# Patient Record
Sex: Male | Born: 1985 | Race: White | Marital: Single | Smoking: Current every day smoker
Health system: Northeastern US, Academic
[De-identification: ages and names within clinical notes are randomized; demographics above are authoritative.]

## PROBLEM LIST (undated history)

## (undated) DIAGNOSIS — F419 Anxiety disorder, unspecified: Secondary | ICD-10-CM

## (undated) DIAGNOSIS — K219 Gastro-esophageal reflux disease without esophagitis: Secondary | ICD-10-CM

## (undated) DIAGNOSIS — N200 Calculus of kidney: Secondary | ICD-10-CM

## (undated) DIAGNOSIS — M5020 Other cervical disc displacement, unspecified cervical region: Secondary | ICD-10-CM

## (undated) DIAGNOSIS — M5126 Other intervertebral disc displacement, lumbar region: Secondary | ICD-10-CM

## (undated) DIAGNOSIS — Z87442 Personal history of urinary calculi: Secondary | ICD-10-CM

## (undated) DIAGNOSIS — F32A Depression, unspecified: Secondary | ICD-10-CM

## (undated) HISTORY — DX: Gastro-esophageal reflux disease without esophagitis: K21.9

## (undated) HISTORY — DX: Other cervical disc displacement, unspecified cervical region: M50.20

## (undated) HISTORY — DX: Anxiety disorder, unspecified: F41.9

## (undated) HISTORY — PX: APPENDECTOMY: SHX54

## (undated) HISTORY — PX: OTHER SURGICAL HISTORY: SHX169

## (undated) HISTORY — PX: WISDOM TOOTH EXTRACTION: SHX21

## (undated) HISTORY — PX: KNEE SURGERY: SHX244

---

## 2010-06-04 ENCOUNTER — Emergency Department
Admission: EM | Admit: 2010-06-04 | Disposition: A | Discharge status: Home / Self Care | Payer: Self-pay | Source: Ambulatory Visit | Attending: Emergency Medicine | Admitting: Emergency Medicine

## 2010-06-04 ENCOUNTER — Encounter: Payer: Self-pay | Admitting: Emergency Medicine

## 2010-06-04 MED ORDER — OXYCODONE-ACETAMINOPHEN 5-325 MG PO TABS *I*
1.0000 | ORAL_TABLET | ORAL | Status: AC | PRN
Start: 2010-06-04 — End: 2010-06-14

## 2010-06-04 MED ORDER — MORPHINE SULFATE 4 MG/ML IJ SOLN
4.0000 mg | Freq: Once | INTRAMUSCULAR | Status: AC
Start: 2010-06-04 — End: 2010-06-04
  Filled 2010-06-04: qty 1

## 2010-06-04 MED ADMIN — Morphine Sulfate Inj 4 MG/ML: 4 mg | INTRAMUSCULAR | NDC 00409125830

## 2010-06-04 MED ADMIN — Morphine Sulfate Inj 4 MG/ML: 4 mg | INTRAVENOUS | NDC 00409125830

## 2010-06-04 NOTE — ED Notes (Signed)
Ortho at bedside.
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
## 2010-06-04 NOTE — ED Notes (Signed)
Sling provided to patient per order

## 2010-06-04 NOTE — ED Notes (Signed)
Pt given d/c instructions along with scripts and verbalized understanding. IV's d/c. Pt given sling for cast. D/c with family.

## 2010-06-04 NOTE — ED Notes (Signed)
Ortho at bedside to splint the patient.
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
## 2010-06-04 NOTE — ED Notes (Signed)
Awaiting ortho consult.  Girlfriend at bedside.

## 2010-06-04 NOTE — ED Notes (Signed)
Pt stated that his sacrum is sore from sitting and pt requested that c collar be removed. Pt mother had concerns about pt receiving morphine for pain. Pt is on probation.
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
## 2017-06-07 ENCOUNTER — Encounter: Payer: Self-pay | Admitting: Urology

## 2017-06-07 ENCOUNTER — Ambulatory Visit: Payer: Self-pay | Attending: Urology | Admitting: Urology

## 2017-06-07 ENCOUNTER — Telehealth: Payer: Self-pay | Admitting: Urology

## 2017-06-07 VITALS — BP 169/99 | HR 100 | Temp 98.8°F | Ht 71.0 in | Wt 250.0 lb

## 2017-06-07 DIAGNOSIS — N202 Calculus of kidney with calculus of ureter: Secondary | ICD-10-CM

## 2017-06-07 DIAGNOSIS — N201 Calculus of ureter: Secondary | ICD-10-CM | POA: Insufficient documentation

## 2017-06-07 LAB — POCT URINALYSIS DIPSTICK
Glucose,UA POCT: NORMAL mg/dL
Leuk Esterase,UA POCT: NEGATIVE
Lot #: 29981701
Nitrite,UA POCT: NEGATIVE
PH,UA POCT: 5 (ref 5–8)
Protein,UA POCT: NEGATIVE mg/dL
Specific gravity,UA POCT: 1.02 (ref 1.002–1.030)

## 2017-06-07 MED ORDER — TAMSULOSIN HCL 0.4 MG PO CAPS *I*
0.4000 mg | ORAL_CAPSULE | Freq: Every evening | ORAL | 0 refills | Status: DC
Start: 2017-06-07 — End: 2018-08-19

## 2017-06-07 NOTE — Progress Notes (Signed)
CHIEF COMPLAINT: Right ureteral stone with renal colic.    HISTORY AND PRESENT ILLNESS: [name] is a 31 y.o. patient who was just diagnosed with his first kidney stone.  Two days ago, he developed acute onset right lower quadrant pain, 9/10 in severity. He also had nausea. He tried oxycodone but vomited. He did not have any fever/chills.     He was seen in the ED at [facility] and a CT scan was obtained.  He had a 3.59mm right distal ureteral stone with mild right hydroureteronephrosis and a non-obstructing left lower pole renal stone. His WBC and Cr were both mildly elevated. UA showed 3+ blood but no nitrites or leukocyte esterase.  He was given flomax, oxycodone and clindamycin (for concurrent treatment of a right tooth abscess) and discharged to home.      He reports that the pain is still present, 3/10 in severity. Tramadol helps. He is still taking flomax. He is straining his urine, but he has not witnessed passage. He reports dysuria and urinary urgency. He denies lower urinary tract obstructive symptoms, such as slow and thin urine stream, voiding difficulty, and feelings of unable to empty the bladder.    He is able to stay hydrated, but is not eating much.    ALLERGIES:   Review of patient's allergies indicates no known allergies (drug, envir, food or latex).     CURRENT MEDICATIONS:    Current Outpatient Prescriptions   Medication Sig Note    tamsulosin (FLOMAX) 0.4 MG capsule Take 0.4 mg by mouth 06/07/2017: Received from: [facility] Received Sig: Take 1 capsule by mouth daily.    clindamycin (CLEOCIN) 300 MG capsule Take 300 mg by mouth 06/07/2017: Received from: [facility] Received Sig: Take 1 capsule by mouth 4 (four) times daily.    traMADol-acetaminophen (ULTRACET) 37.5-325 MG per tablet Take 1 tablet by mouth 06/07/2017: Received from: [facility] Received Sig: Take 1 tablet by mouth every 6 (six) hours as needed for Pain.    cyclobenzaprine (FLEXERIL)  10 MG tablet Take 10 mg by mouth 06/07/2017: Received from: [facility] Received Sig: Take 10 mg by mouth 3 (three) times daily as needed for Muscle spasms.     No current facility-administered medications for this visit.         PAST MEDICAL HISTORY:    Herniated disk    PAST SURGICAL HISTORY:  Appendectomy  Left knee arthroscopy     FAMILY HISTORY:   Dad - enlarged prostate. Mom- urinary incontinence. Maternal grandfather - prostate cancer.     SOCIAL HISTORY:  Social History     Social History    Marital status: Single     Spouse name: N/A    Number of children: N/A    Years of education: N/A     Social History Main Topics    Smoking status: Current Every Day Smoker     Packs/day: 0.50    Smokeless tobacco: Never Used    Alcohol use No    Drug use: None    Sexual activity: Not Asked     Other Topics Concern    None     Social History Narrative   He works in Engineering geologist at [employer].    REVIEW OF SYSTEMS:   Constitutional: Denies recent weight loss or gain or fatigue.  Eyes: Denies change in vision  ENT: Denies hearing problems, or loss. Denies swollen glands or stiff neck.  Cardiovascular: Denies recent heart  trouble, chest pain or palpitations.  Respiratory: Denies recent cold, flu or upper respiratory illness. Denies cough or dyspnea.  Gastrointestinal: Denies constipation, diarrhea, acid reflux or bloody stool.  Positive for right lower quadrant pain.  Genitourinary: Positive for dysuria and urinary urgency.  No obstructive urinary symptoms.  MSK: Denies any muscle or joint pain stiffness or arthritis.  Skin: Denies rash, ulcers or skin problems.  Neuro: Denies numbness, tingling, dizziness or headaches.  Pysch: Denies depression, anxiety or psychosis.  Endocrine: Denies diabetes, hypothyroidism or hyperthyroidism.  Hematology: Denies recent bleeding, bruising or anemia.  Allergy/Immunological: Denies runny nose, allergic rhinitis, or itching eyes.    PHYSICAL EXAM:  BP (!) 169/99    Pulse 100   Temp 37.1 C (98.8 F)   Ht 1.803 m (5\' 11" )   Wt 113.4 kg (250 lb)   BMI 34.87 kg/m2  GENERAL:  No acute distress, well developed, well nourished  HEENT:  Normocephalic, atraumatic. Extraocular movements intact. Oropharynx clear.  Neck supple. Trachea midline. Mucous membranes moist.  RESPIRATORY:  Respirations unlabored on room air.    ABDOMINAL:  Abdomen soft, tender to palpation in the lower quadrants. Nondistended, and without masses.   BACK/ORTHO:  No costovertebral angle tenderness.  No tenderness of the axial skeleton.  No obvious back deformities.  GENITOURINARY: Normal circumcised phallus with redundant foreskin.  Normal meatus.  No meatal discharge.  Cutaneous examination of phallus and scrotum unremarkable.  Bilateral testes measure approximately 3 cm in the craniocaudad axis and are without suspicious masses or tenderness.   No suspicious epididymal or cord masses/tenderness.  LYMPHATIC:  Normal inguinal examination.  PULSES:  groin palpable bilaterally and symmetric.   EXTREMITIES:  Without cyanosis, clubbing, or edema. Calves non-tender.  SKIN:  Normal color, turgor, texture, hydration.  NEUROLOGIC:  Oriented to person, place, time, and situation.  PSYCHIATRIC:  Normal mood and affect.    LABS:  Recent Results (from the past 24 hour(s))   POCT urinalysis dipstick    Collection Time: 06/07/17  1:44 PM   Result Value Ref Range    Specific gravity,UA POCT 1.020 1.002 - 1.030    PH,UA POCT 5.0 5 - 8    Leuk Esterase,UA POCT Negative Negative    Nitrite,UA POCT Negative Negative    Protein,UA POCT Negative Negative mg/dL    Glucose,UA POCT Normal Normal mg/dL    Ketones,UA POCT ++ moderate Negative mg/dL    Urobilinogen,UA  Less than 1 mg/dL    Bilirubin,Ur  Negative    Blood,UA POCT About 250 (!) Negative    Exp date 06/21/2018     Lot # [id number]      OSH labs:  WBC 12.5  Cr 1.2, Ca 9.4    CT scan abdomen/pelvis 06/05/17 (report from OSH and imaging both reviewed):  There is mild right  hydronephrosis and hydroureter with a 3.5 mm obstructing calculus in the distal right ureter image 3-152. There is mild right perinephric stranding. There is a 2 to 3 mm nonobstructing calculus in the inferior pole the left kidney. The unenhanced kidneys are otherwise normal.   Bladder:The bladder is decompressed but otherwise normal.       IMPRESSION: Right renal colic secondary to an obstructing 3.7mm right distal ureteral stone with hydroureteronephrosis. Non-obstructing 73mm left renal stone.     PLAN:   I reviewed the outside imaging and labs with the patient and mother.  Likelihood of spontaneous passage of the right ureteral stone is high given the size and location. We  discussed management options, including continued observation, medical management, or ureteroscopy with/without laser lithotripsy.     I discussed that the risk of continued observation includes persistent or worsening symptoms and further deterioration of renal function which may result in loss of the kidney.   Surgery would involve general anesthesia and removal of the stone under direct visualization which may require fragmentation into smaller pieces using laser.  A temporary ureteral stent would be placed to allow the kidney to drain while the tissues are healing following surgery.  I also explained that in the setting of an impacted stone we may not be able to safely reach the stone with our scope.  In this case I would leave a stent and allow the ureter to passively dilate and proceed with a second stage procedure at a later date.  I also discussed that some patients may experience stent colic and we have medications that can alleviate the symptoms in most cases.    At this time, he would like to proceed with a trial of passage. I recommended that he continue flomax (refill was given to extend course to 6 weeks). He should alternate tylenol with ibuprofen (600mg  TID x1wk) as needed for pain, and he can take the tramadol in between  for additional pain relief.  I encouraged him to increase oral hydration. He will continue to strain his urine in the interim and save any passed stone for stone analysis.      We will see him back in one month for clinical reassessment.  He was advised to call for sooner evaluation if pain is not well controlled, UTI symptoms or fever develops, or if he develops intractable nausea/vomiting.  We will send a urine culture in case he may require surgery. All questions were answered to patient and mom's satisfaction.      Sincerely,    Smiley Houseman, MD

## 2017-06-07 NOTE — Telephone Encounter (Signed)
Patient's mother, Arline Asp, is calling to schedule an appointment for [facility]. She states she brought him to the Emergency Department in [place] on Wednesday night, where they discovered he has kidney stones. She states she had to bring him again last night because his pain was an 8/10. She states they suggested Ronaldo Miyamoto see an Insurance underwriter today for a follow up.     Please return Cindy's call at [phone] to discuss further

## 2017-06-07 NOTE — Telephone Encounter (Signed)
Spoke with pt who stated CT results while in ED showed kidney stones on Wednesday on both sides. Continued with pain last night and returned to ED due to inability to urinate and uncontrolled pain. Per Dr Reynolds Bowl pt can come to [facility] office at 130 today. Please add to her schedule.

## 2017-06-07 NOTE — Telephone Encounter (Signed)
Patient is calling regarding the information below. He can be reached at [phone]

## 2017-06-08 LAB — AEROBIC CULTURE: Aerobic Culture: 0

## 2017-06-10 NOTE — Telephone Encounter (Signed)
Patient is calling to request a sooner appointment with Dr. Reynolds Bowl. Patient states he was seen in the ED on Saturday again for kidney stones. Per cadence first available is 9/4.    Please return his call at: [phone]

## 2017-06-10 NOTE — Telephone Encounter (Signed)
You saw this patient on Friday. He went to the ED at [facility]. When would you like to see patient back in the office?

## 2017-06-12 ENCOUNTER — Other Ambulatory Visit: Payer: Self-pay | Admitting: Urology

## 2017-06-12 ENCOUNTER — Encounter: Payer: Self-pay | Admitting: Gastroenterology

## 2017-06-12 ENCOUNTER — Encounter: Payer: Self-pay | Admitting: Urology

## 2017-06-12 ENCOUNTER — Other Ambulatory Visit: Payer: Self-pay | Admitting: Emergency Medicine

## 2017-06-12 DIAGNOSIS — N201 Calculus of ureter: Secondary | ICD-10-CM

## 2017-06-12 LAB — URINALYSIS WITH REFLEX TO MICROSCOPIC
Bilirubin,Ur: NEGATIVE
Glucose, Ur: NEGATIVE mg/dL
Ketones, UA: NEGATIVE mg/dL
Nitrite,UA: NEGATIVE
Protein,UA: NEGATIVE mg/dL
RBC,UA: 0 /HPF (ref 0–5)
Specific Gravity,UA: 1.013 (ref 1.005–1.030)
Urobilinogen,UA: 1 mg/dL
WBC,UA: >40 /HPF — ABNORMAL HIGH (ref 0–5)
pH: 6 (ref 5.0–8.0)

## 2017-06-12 LAB — BASIC METABOLIC PANEL
Anion Gap: 16 (ref 7–16)
CO2: 27 mmol/L (ref 20–28)
Calcium: 9.8 mg/dL (ref 9.0–10.3)
Chloride: 100 mmol/L (ref 96–108)
Creatinine: 0.97 mg/dL (ref 0.67–1.17)
GFR,Black: 112 mL/min/{1.73_m2}
GFR,Caucasian: 92 mL/min/{1.73_m2}
Glucose: 94 mg/dL (ref 60–99)
Lab: 10 mg/dL (ref 6–20)
Potassium: 4.2 mmol/L (ref 3.4–4.7)
Sodium: 143 mmol/L (ref 133–145)

## 2017-06-12 LAB — CBC AND DIFFERENTIAL
Baso # K/uL: 0.1 10*3/uL (ref 0.0–0.1)
Basophil %: 0.4 % (ref 0.1–1.2)
Eos # K/uL: 0.3 10*3/uL (ref 0.0–0.5)
Eosinophil %: 2.1 % (ref 0.8–7.0)
Hematocrit: 40.2 % (ref 40.1–51.0)
Hemoglobin: 14 g/dL (ref 13.7–17.5)
Immature Granulocytes Absolute: 0.09 10*3/uL (ref 0.0–0.2)
Immature Granulocytes: 0.7 % (ref 0.0–2.0)
Lymph # K/uL: 3.2 10*3/uL (ref 1.3–3.6)
Lymphocyte %: 23.6 % (ref 21.8–53.1)
MCH: 28.9 pg (ref 25.7–32.2)
MCHC: 34.8 g/dL (ref 32.3–36.5)
MCV: 83.1 fL (ref 79.0–92.2)
Mono # K/uL: 0.8 10*3/uL (ref 0.3–0.8)
Monocyte %: 5.8 % (ref 5.3–12.2)
Neut # K/uL: 9.1 10*3/uL — ABNORMAL HIGH (ref 1.8–5.4)
Nucl RBC # K/uL: 0 /100 WBC (ref 0.0–0.2)
Platelets: 357 10*3/uL — ABNORMAL HIGH (ref 163–337)
RBC Distribution Width-SD: 37.2 fL (ref 35.1–43.9)
RBC: 4.84 10*6/uL (ref 4.63–6.08)
RDW: 12.2 % (ref 11.6–14.4)
Seg Neut %: 67.4 % (ref 34.0–67.9)
WBC: 13.5 10*3/uL — ABNORMAL HIGH (ref 4.2–9.1)

## 2017-06-12 MED ORDER — OXYBUTYNIN CHLORIDE 5 MG PO TABS *I*
5.0000 mg | ORAL_TABLET | Freq: Three times a day (TID) | ORAL | 0 refills | Status: DC | PRN
Start: 2017-06-12 — End: 2018-08-19

## 2017-06-12 MED ORDER — PHENAZOPYRIDINE HCL 100 MG PO TABS *I*
100.0000 mg | ORAL_TABLET | Freq: Three times a day (TID) | ORAL | 0 refills | Status: DC | PRN
Start: 2017-06-12 — End: 2018-08-19

## 2017-06-12 MED ORDER — CIPROFLOXACIN HCL 500 MG PO TABS *I*
500.0000 mg | ORAL_TABLET | Freq: Two times a day (BID) | ORAL | 0 refills | Status: DC
Start: 2017-06-12 — End: 2018-04-22

## 2017-06-13 ENCOUNTER — Telehealth: Payer: Self-pay | Admitting: Urology

## 2017-06-13 LAB — AEROBIC CULTURE

## 2017-06-13 NOTE — Telephone Encounter (Signed)
Spoke with pt who is requesting call from MD, states he does not remember anything she spoke with him about after stent placement and questioning if stone was removed. Nurse unable to answer this question. Writer told pt message would be sent to MD.

## 2017-06-13 NOTE — Telephone Encounter (Signed)
[name] is calling to speak to a nurse about his stent . He stated that he was under anesthesia and he doesn't remember anything that the nurse told him about the stent.    Patient can be reach [phone] (H)

## 2017-06-14 ENCOUNTER — Telehealth: Payer: Self-pay | Admitting: Urology

## 2017-06-14 NOTE — Telephone Encounter (Signed)
Called patient in regards to appointments and he is still waiting for a call back with regards to his surgery and still has some questions with regards to weigh limits.

## 2017-06-14 NOTE — Telephone Encounter (Signed)
Patient is calling to ask some questions about lifting and more specific information. What does "Limit Lifting" mean?    Patient can be reached at [phone]

## 2017-06-16 LAB — STONE ANALYSIS
Calculi Mass: 26 mg
Calculi Number: 1
Stone Weight: 1 mm

## 2017-06-17 ENCOUNTER — Telehealth: Payer: Self-pay | Admitting: Urology

## 2017-06-17 ENCOUNTER — Encounter: Payer: Self-pay | Admitting: Urology

## 2017-06-17 NOTE — Telephone Encounter (Signed)
When can pt go back to work

## 2017-06-17 NOTE — Telephone Encounter (Signed)
Previous encounter indicated patient may return to work without restriction.

## 2017-06-17 NOTE — Telephone Encounter (Signed)
Patient called and stated that he is supposed to be starting back to work tomorrow (06/18/17) and is waiting for a call so that he can pick up a letter.     He asked to be called back today at [phone].

## 2017-06-17 NOTE — Telephone Encounter (Signed)
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 He can be reached at [phone] (H)

## 2017-07-01 ENCOUNTER — Telehealth: Payer: Self-pay | Admitting: Urology

## 2017-07-01 NOTE — Telephone Encounter (Signed)
Spoke to patient and confirmed upcoming appointment on 07/04/2017

## 2017-07-04 ENCOUNTER — Encounter: Payer: Self-pay | Admitting: Urology

## 2017-07-04 ENCOUNTER — Ambulatory Visit: Payer: Self-pay | Attending: Urology | Admitting: Urology

## 2017-07-04 ENCOUNTER — Ambulatory Visit: Payer: Self-pay | Admitting: Urology

## 2017-07-04 VITALS — BP 128/76 | HR 79 | Ht 71.0 in | Wt 255.0 lb

## 2017-07-04 DIAGNOSIS — N200 Calculus of kidney: Secondary | ICD-10-CM

## 2017-07-04 MED ORDER — CIPROFLOXACIN HCL 500 MG PO TABS *I*
500.0000 mg | ORAL_TABLET | Freq: Once | ORAL | Status: AC
Start: 2017-07-04 — End: 2017-07-04
  Administered 2017-07-04: 500 mg via ORAL

## 2017-07-04 NOTE — Patient Instructions (Addendum)
Cystoscopy Frequently Asked Questions    Indication:  To evaluate blood in the urine, to assess possible causes of incontinence.    Do we use a numbing agent?   We may use 2% lidocaine jelly, depending on your diagnosis.  It is a gel that is placed into the urethra.  No needle is used.  The numbing effect will last 1 to 2 hours.  Pt will still be able to feel the urge to urinate.    Should I eat or drink the day of the procedure?  Yes!  Please eat and drink as normal the day of the procedure.  Being well hydrated will help the doctor better visualize the ureter and kidney function.    Are there any medications I should stop taking before the procedure?  No.  You may take all your medications.    How long will the procedure take?  You should plan to be in the office for at least an hour to allow time for paperwork and prep.  From the time the doctor enters the room, the procedure itself takes between 5 to 15 minutes to complete.    What will happen during my procedure?  You will be asked to provide a urine specimen prior to the procedure to ensure there is no current infection.  If you are found to have an infection, the procedure may be rescheduled at the Doctors discretion.  You will be brought back to a procedure room, vital signs will be take, and you will be asked to sign a consent form.  You will then be asked to undress from the waist down, and you will lie down on the procedure table.  Your genitals will be cleansed with an antibacterial soap, and the lidocaine gel will be instilled into the urethra.  The doctor will insert the cystoscope into the bladder via the urethra.  He will be able to visualize the urethra, the bladder and the ureters.  A sample of the fluid taken from the bladder will be sent to pathology to look for any abnormal cells.  The result takes 7-10 days to come back.  We will either call you with the result, or your doctor may have you return to the office to discuss the results of all  tests.    What kind of scope is used?  We use a flexible cystoscope in our office.    Will it hurt?  Most people feel a large amount of pressure, a strong urge to urinate and a little burning along the urethra, although the experience is is different for everyone.      Will I have antibiotics afterward?  This will be at the discretion of your doctor.    How long before I may resume sexual activity?  The same day.      Will I have any after effects?  You may experience burning with urination and a small amount of blood in the urine for a few days afterwards.  This is normal.  Drink plenty of fluids for a few days to keep the urinary tract flushed out.    Contact your Doctor if you develop the following:  · Excessive pain  · Prolonged excessive bleeding or temperature greater then 101 degrees fahrenheit     Will I need to be out of work or restrict my activities for any length of time afterward?  You may return to all normal activities the same day.            Quitting Smoking for Older Adults    About this topic   You may have started smoking when you were much younger. As an adult, you may smoke regularly. It is possible to become addicted to smoking. The longer you have been smoking, the harder it is to give up.  Most people have heard of the dangers of smoking. It is helpful to your health if you stop smoking. It also helps the health of others.  General   Older smokers are at high risk from smoking. The longer you have smoked, the more toxins you have been exposed to. Toxins are the bad chemicals in the tobacco. As someone who has smoked for a long time, you are more likely to have illnesses related to smoking.  It is never too late to quit smoking. It helps your health even at an older age. You will begin to notice changes soon after you stop smoking. Here are some steps you can take to help you quit smoking:  · Set a date to quit smoking.  · Pay attention to when you smoke now and why you are smoking. Some people  find it helpful to write down each time you smoke. Include the hour and what you are doing. This will let you plan ahead about what you will do instead of smoking during those times.  · Slowly reduce your smoking until your quit date.  · Remove cigar and other tobacco products from your home, car, and workplace.  · Avoid places and situations where you are more likely to smoke. If people close to you smoke, ask them to quit with you.  · Reward or treat yourself every time you do not smoke. Do not use food as a reward. Perhaps place the cost of the cigarette you did not smoke into a collection jar and save the change towards a special occasion or reward.  · Ask your doctor for help.  · Call [place] Smokers' Quitline for free help at 1-866-697-8487 or www.nysmokefree.com.     What will the results be?   When you start to quit:  · Blood flow improves right away.  · Your lungs become more active.  · Heart rate and blood pressure lower.  · You have less chance of having a heart attack.  · You will help others be healthy since they are not around secondhand smoke.  After a few days to weeks:  · Food tastes and smells better.  · Your lungs begin to work better.  · Breathing becomes easier.  After a few weeks to months:  · You have more energy.  · Lungs become clear and work better.  · You are less likely to get colds and other lung infections.  · Shortness of breath decreases.  · Clogging of sinuses decreases.  When you have fully stopped smoking, after a while you will:  · Lower the risk of lung cancer  · Lower the risk of cancer of the mouth, esophagus, voice box, bladder, pancreas, cervix, and kidney  · Lower the risk of heart illnesses like stroke and heart attack  · Preserve your eyesight and the look of your teeth and aging skin  · Lessen the risk for having type 2 diabetes  · Reverse bone loss  · Lessen the risk of postop problems  · Lessen the risk of peptic ulcer and improve healing if ulcer is already  there   What lifestyle changes are needed?   · Thoroughly wash   and remove all ashtrays from your home and office.  · Exercise regularly. This may help to lower stress. Going for a walk is good exercise.  · Watch your eating habits and avoid gaining weight. Eat healthy foods and snacks to keep a healthy weight  · Change your daily routine. Try to change things. Eat breakfast at a different place or drink tea instead of coffee.  · Try to relax. Listen to music, meditate, do yoga or breathing exercises, take a relaxing bath or hot shower.  · Control your thoughts and emotions. Write or record a journal, create new hobbies, and think positive.  · Connect with family and friends. Talk to a friend, get a pet, share your problems with family members, and participate in your community.  What drugs may be needed?   The doctor may order drugs to:  · Help with withdrawal signs  · Reduce your urges to smoke  Gums, lozenges, and skin patches may be given as a substitute for tobacco.  Will there be any other care needed?   It helps to have other people to support you when you are trying to quit smoking. Talk to your family and friends about how they can best help you. You may also want to think about support groups or counseling.  What problems could happen?   You may have withdrawal signs like:  · Trouble sleeping  · Being irritable  · Being anxious or restless  · Getting frustrated or angry  · Trouble thinking clearly  · Low mood  When do I need to call the doctor?   · Feel very nervous  · Low mood  · Have behavioral changes  · Think about harming or killing yourself  · Have unexpected side effects from any prescribed drugs  Helpful tips   · Commit yourself to stop smoking.  · Focus on your goals and work to achieve them.  Where can I learn more?   Agency for Healthcare Research and Quality  http://www.ahrq.gov/patients-consumers/prevention/lifestyle/tobacco/helpsmokers.html   American Cancer  Society  http://www.cancer.org/acs/groups/cid/documents/webcontent/002971-pdf.pdf   American Lung Association  http://www.lung.org/stop-smoking/about-smoking/facts-figures/smoking-and-older-adults.html   Centers for Disease Control and Prevention  http://www.cdc.gov/tobacco/campaign/tips/   http://www2c.cdc.gov/podcasts/media/pdf/HealthyAgingSmoking.pdf   Smoke Free  http://www.smokefree.gov/   Last Reviewed Date   2012-11-18  Consumer Information Use and Disclaimer   This information is not specific medical advice and does not replace information you receive from your health care provider. This is only a brief summary of general information. It does NOT include all information about conditions, illnesses, injuries, tests, procedures, treatments, therapies, discharge instructions or life-style choices that may apply to you. You must talk with your health care provider for complete information about your health and treatment options. This information should not be used to decide whether or not to accept your health care provider’s advice, instructions or recommendations. Only your health care provider has the knowledge and training to provide advice that is right for you.  Copyright   Copyright © 2015 [employer] and its affiliates and/or licensors. All rights reserved.

## 2017-07-04 NOTE — Progress Notes (Signed)
CHIEF COMPLAINT: Right ureteral stone with renal colic.    UROLOGIC HISTORY: [name] is a 31 y.o. patient who presented to me in mid August 2018 with his first kidney stone. [facility] with RLQ pain and nausea and was found to have a 3.505mm right distal ureteral stone with mild right hydroureteronephrosis and a non-obstructing left lower pole renal stone. His WBC and Cr were both mildly elevated. He was started on medical expulsive therapy.  Due to persistent symptoms, he elected to proceed with surgical treatment. On 06/12/17, he underwent right ureteral dilation, stone extraction and placement of a right ureteral stent.       INTERVAL HISTORY:  He is here for stent removal. Reports RLQ pain with voiding. +urinary urgency. No fevers/chills or nausea/vomiting. No grosss hematuria.       ALLERGIES:   Peppers; Morphine; and Percocet [oxycodone-acetaminophen]     CURRENT MEDICATIONS:    Current Outpatient Prescriptions   Medication Sig Note    traMADol-acetaminophen (ULTRACET) 37.5-325 MG per tablet Take 1 tablet by mouth 06/07/2017: [facility] Received Sig: Take 1 tablet by mouth every 6 (six) hours as needed for Pain.    cyclobenzaprine (FLEXERIL) 10 MG tablet Take 10 mg by mouth 06/07/2017: [facility] Received Sig: Take 10 mg by mouth 3 (three) times daily as needed for Muscle spasms.    clindamycin (CLEOCIN) 300 MG capsule Take 300 mg by mouth 06/07/2017: [facility] Received Sig: Take 1 capsule by mouth 4 (four) times daily.    ciprofloxacin (CIPRO) 500 MG tablet Take 1 tablet (500 mg total) by mouth 2 times daily     phenazopyridine (PYRIDIUM) 100 MG tablet Take 1 tablet (100 mg total) by mouth 3 times daily as needed (urinary discomfort)     oxybutynin (DITROPAN) 5 MG tablet Take 1 tablet (5 mg total) by mouth 3 times daily as needed (bladder spasms)     tamsulosin (FLOMAX) 0.4 MG capsule Take  1 capsule (0.4 mg total) by mouth every evening      No current facility-administered medications for this visit.         PAST MEDICAL HISTORY:    Herniated disk  Right ureteral stone s/p uscope    PAST SURGICAL HISTORY:  Appendectomy  Left knee arthroscopy  R ureteral dilation, stone extraction, R ureteral stent 06/12/17     FAMILY HISTORY:   Dad - enlarged prostate. Mom- urinary incontinence. Maternal grandfather - prostate cancer.     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    REVIEW OF SYSTEMS:   Constitutional: Denies recent weight loss or gain or fatigue. No fever   Gastrointestinal: Denies constipation, diarrhea, acid reflux or bloody stool. No nausea/vomiting  Genitourinary: As per HPI  MSK: Denies any muscle or joint pain stiffness  or arthritis.    PHYSICAL EXAM:  BP 128/76   Pulse 79   Ht 1.803 m ( )   Wt 115.7 kg (255 lb)   BMI 35.57 kg/m2  GENERAL:  No acute distress, well developed, well nourished  RESPIRATORY:  Respirations unlabored on room air.  Symmetric chest rise  PSYCHIATRIC:  Normal mood and affect.      LABS:  No results found for this or any previous visit (from the past 24 hour(s)).  OSH labs:  WBC 12.5  Cr 1.2, Ca 9.4    Stony Analysis 822/18  Calculi Composition  See Note   Comments: Calculi composed primarily of:   70% calcium oxalate monohydrate, and   30% calcium phosphate (hydroxy- and carbonate- apatite).          CT scan abdomen/pelvis 06/05/17 (report from OSH and imaging both reviewed):  There is mild right hydronephrosis and hydroureter with a 3.5 mm obstructing  calculus in the distal right ureter image 3-152. There is mild right perinephric stranding. There is a 2 to 3 mm nonobstructing calculus in the inferior pole the left kidney. The unenhanced kidneys are otherwise normal.   Bladder:The bladder is decompressed but otherwise normal.     Cystoscopy Procedure Note (07/04/17)  Indications: s/p R uscope and stent    Pre-procedure Diagnosis: as above  Post-oprocedure Diagnosis: as above  Surgeon: Talmadge Chad, MD    Anesthesia: viscous 1% lidocaine  Procedure Details:   The risks, benefits, complications, treatment options, and expected outcomes were discussed with the patient. The patient concurred with the proposed plan, giving informed consent.  Cystoscopy was performed today, using sterile technique. The patient was placed in the supine position, prepped  and draped in the usual sterile fashion. Cystoscopy was carried out with a flexible cytoscope with video guidance.  Findings:  Anterior urethra: normal without strictures and without scarring.   Prostatic urethra: mild hyperplasia, without bladder neck contracture.  Right ureteral stent not encrusted - was grasped and removed in entirety without difficulty  Specimens: right ureteral stent, intact  Disposition: To home       IMPRESSION:   1. Hx of a 3.68mm right distal ureteral stone s/p R ureteroscopy and stone extraction.   2. Non-obstructing 3mm left renal stone.     PLAN:   Right ureteral stent removed without difficulty. Cipro  was given for urinary prophylaxis. Recommended continuation of flomax x1wk as there may still be some post-surgical inflammation which may cause transient renal colic.    Recommended metabolic stone workup to assess for risk factors for stone formation.  He agreed to proceed. He will follow-up in 3 months with a renal US and metabolic labs (performed one month prior to appointment).     He was advised to follow-up with urology sooner if persistent/worsening renal colic, gross hematuria,  UTI symptoms, fever, or other urologic concerns.      Sincerely,    Smiley Houseman, MD

## 2017-07-04 NOTE — Progress Notes (Signed)
PRE-PROCEDURE TIME OUT    What procedure is being performed? Stent removal  Correct procedure? yes  Correct Patient (use 2 identifiers)? yes  Correct Site? yes  Site Marked? N/A  Correct Side? N/A  Correct patient position? yes  Consent verified? yes  Tests results available?Yes  Imaging results available? Yes  List participants involved in time out. Dr. Diane Lu, B. Cyra Spader, LPN  Availability of correct instruments and any special equipment or requirements? yes

## 2017-07-05 ENCOUNTER — Telehealth: Payer: Self-pay | Admitting: Urology

## 2017-07-05 NOTE — Telephone Encounter (Signed)
[name] is calling to ask when can he resume sexual activity after having the stent removed 07/04/17.    Patient can be reached at [phone]

## 2017-07-05 NOTE — Telephone Encounter (Signed)
Pt advised of below message . States understanding

## 2017-07-08 ENCOUNTER — Telehealth: Payer: Self-pay | Admitting: Urology

## 2017-07-08 NOTE — Telephone Encounter (Signed)
Spoke with patient to inform him that we have his completed disability paperwork. We wanted to clarify as the form stated to give to patient but it also included a fax #. Patient stated to fax completed form to number. Has been faxed.

## 2017-08-02 ENCOUNTER — Encounter: Payer: Self-pay | Admitting: Urology

## 2017-10-09 ENCOUNTER — Ambulatory Visit: Payer: Self-pay | Admitting: Urology

## 2017-10-10 ENCOUNTER — Ambulatory Visit: Payer: Self-pay | Admitting: Urology

## 2017-11-22 NOTE — Op Note (Signed)
[facility]                              [address]                            [place], [place]  [address]                                [phone]     PATIENTSULEYMAN, [name]                              MR#:  [id number]  DOB:     Sep 26, 1986                               AGE:  32  FAMILY PHYSICIAN:  NONE PCP-FP, MD                LOC:  [facility] 19                                  OPERATIVE REPORT     DATE OF SURGERY:   06/12/2017     SURGEON:           Talmadge Chad, MD     PREOPERATIVE DIAGNOSIS:  Right renal colic secondary to a 4 mm right distal ureteral stone.     POSTOPERATIVE DIAGNOSIS:  Right renal colic secondary to a 4 mm right distal ureteral stone.     PROCEDURE DONE:  Cystoscopy, right retrograde pyelogram, right ureteral dilation, right  semirigid ureteroscopy, stone extraction and placement of a right ureteral   stent.     ASSISTANT(S):  Misty Stanley.     ANESTHESIA:  General.     IV FLUIDS:  See Anesthesia report.     URINARY OUTPUT:  Not recorded.     DRAINS:  A 6-French by 26 cm right ureteral stent, without extraction string.     SPECIMENS:  Right ureteral stone for stone analysis.     COMPLICATIONS:  None.     FINDINGS:  Right distal ureteral stone located approximately 5 cm proximal to the right   ureterovesical junction.  Stone was removed using basket.     INDICATIONS:  [name] is a 32 year old gentleman who initially presented to the  emergency room with renal colic and was found to have a 4 mm right distal   ureteral stone.  He failed a trial of medical management and wished to  proceed forward with surgical stone removal.  Risks and benefits were  discussed prior to surgery and he consented to proceed.  Surgical site   laterality was marked prior to the start of the  case.     DESCRIPTION OF PROCEDURE:  The patient was brought to the OR and positively identified.  He was placed   supine on the OR table.  SCDs were placed to the bilateral lower  extremities.   Following the induction of general anesthesia, he was placed  into the dorsal lithotomy position with legs secured in Yellofin stirrups.  His external genitalia was then prepped and draped in a standard sterile   fashion.  We confirmed administration  of ciprofloxacin preoperatively for   urinary prophylaxis.  Surgical pause was conducted and all were in  agreement.     A 21-fr rigid cystoscope was advanced into the urethra, which revealed no  gross abnormalities.  Panendoscopy of the bladder revealed no mucosal   abnormalities, bladder calculi, or tumors. Bilateral ureteral orifices were   orthotopic.     Scout radiograph did not show an obvious stone.  Right retrograde  pyelography was then performed using a 5-French open ended catheter with   Omnipaque 300 contrast media diluted 50%.  A filling defect was noted in the  right distal ureter approximately 5 cm proximal to the right ureterovesical   junction suggestive of the stone seen on preoperative CT scan.     A sensor guidewire was then advanced to the right kidney under fluoroscopic   guidance without difficulty.  I next attempted semirigid ureteroscopy  alongside the guidewire, but it appeared that the ureteral opening was a  little tight.  I elected to proceed with dilation of the ureter segment just   distal to the stone using a Bard ureteral balloon dilator, 6 mm by 4 cm.  This was done under fluoroscopic guidance.  I then performed semirigid   ureteroscopy alongside the guidewire with aid of a second Bentson wire to  guide the scope proximally.  The stone was then identified.  A Bard tipless   nitinol wire basket was then used to extract the stone in its entirety  without difficulty.  The stone was sent off for analysis.     A 6-French by 26 cm right ureteral stent was then placed under fluoroscopic   guidance, confirming curls in the right renal pelvis and bladder.  The stent   string was left short. The bladder was then drained and the cystoscope was    withdrawn.     11 cc of 2% lidocaine jelly was instilled per urethra at the end of the case  for patient comfort.  He was awoken from general anesthesia and promptly   returned to PACU in stable condition.     DISPOSITION:  The patient will return for followup in clinic on July 04, 2017 for   flexible cystoscopy and right ureteral stent removal.  He will be discharged  to home on a 3 day course of ciprofloxacin, Ditropan, and pyridium.  He is  to continue the Flomax until the stent comes out.  He may continue his   previously prescribed Tramadol for pain in addition to Tylenol.     ____________________________________  Talmadge Chad, MD     DDL/MODL/[id number]//[id number]/DD: 06/12/2017 13:59:23/DT: 06/12/2017 14:35:41     CC: None Pcp-Fp, MD     Electronically Authenticated and Edited by:  Jimmy Footman. Deysha Cartier, MD on 06/23/2017 04:31 PM EDT

## 2018-04-18 LAB — UNMAPPED LAB RESULTS
Basophil # (HT): 0 10 3/uL — NL (ref 0.0–0.1)
Basophil % (HT): 0 % — NL (ref 0–3)
Eosinophil # (HT): 0.2 10 3/uL — NL (ref 0.0–0.7)
Eosinophil % (HT): 2 % — NL (ref 0–5)
Hematocrit (HT): 42 % — ABNORMAL LOW (ref 43–51)
Hemoglobin (HGB) (HT): 14.8 g/dL — NL (ref 13.8–17.9)
Lymphocyte # (HT): 3.1 10 3/uL — NL (ref 0.6–3.3)
Lymphocyte % (HT): 34 % — NL (ref 15–45)
MCHC (HT): 35.6 g/dL — ABNORMAL HIGH (ref 31.8–34.9)
MCV (HT): 83 fL — NL (ref 80–97)
Mean Corpuscular Hemoglobin (MCH) (HT): 29.5 pg — NL (ref 26.6–30.7)
Mean Platelet Volume (HT): 9.9 fL — NL (ref 8.9–12.3)
Monocyte # (HT): 0.6 10 3/uL — NL (ref 0.1–1.1)
Monocyte % (HT): 6 % — NL (ref 0–15)
Neutrophil # (HT): 5.1 10 3/uL — NL (ref 2.0–7.2)
Platelets (HT): 260 10 3/uL — NL (ref 142–414)
RBC (HT): 5.01 10 6/uL — NL (ref 4.69–6.09)
RDW (HT): 13.4 % — NL (ref 12.9–16.0)
Seg Neut % (HT): 57 % — NL (ref 45–75)
WBC (HT): 9 10 3/uL — NL (ref 4.7–10.6)

## 2018-04-21 ENCOUNTER — Emergency Department: Payer: Self-pay

## 2018-04-21 ENCOUNTER — Emergency Department
Admission: EM | Admit: 2018-04-21 | Discharge: 2018-04-21 | Disposition: A | Discharge status: Home / Self Care | Payer: Self-pay | Source: Ambulatory Visit | Attending: Physician Assistant | Admitting: Physician Assistant

## 2018-04-21 DIAGNOSIS — R109 Unspecified abdominal pain: Secondary | ICD-10-CM

## 2018-04-21 DIAGNOSIS — N200 Calculus of kidney: Secondary | ICD-10-CM

## 2018-04-21 DIAGNOSIS — N201 Calculus of ureter: Secondary | ICD-10-CM | POA: Insufficient documentation

## 2018-04-21 DIAGNOSIS — N1330 Unspecified hydronephrosis: Secondary | ICD-10-CM

## 2018-04-21 HISTORY — DX: Calculus of kidney: N20.0

## 2018-04-21 LAB — COMPREHENSIVE METABOLIC PANEL
ALT: 33 U/L (ref 0–50)
AST: 20 U/L (ref 0–50)
Albumin: 4.6 g/dL (ref 3.5–5.2)
Alk Phos: 100 U/L (ref 40–130)
Anion Gap: 9 (ref 7–16)
Bilirubin,Total: 0.3 mg/dL (ref 0.0–1.2)
CO2: 29 mmol/L — ABNORMAL HIGH (ref 20–28)
Calcium: 9.7 mg/dL (ref 9.0–10.3)
Chloride: 104 mmol/L (ref 96–108)
Creatinine: 0.8 mg/dL (ref 0.67–1.17)
GFR,Black: 139 *
GFR,Caucasian: 120 *
Glucose: 102 mg/dL — ABNORMAL HIGH (ref 60–99)
Lab: 9 mg/dL (ref 6–20)
Potassium: 4.8 mmol/L — ABNORMAL HIGH (ref 3.4–4.7)
Sodium: 142 mmol/L (ref 133–145)
Total Protein: 7.3 g/dL (ref 6.3–7.7)

## 2018-04-21 LAB — CBC AND DIFFERENTIAL
Baso # K/uL: 0 10*3/uL (ref 0.0–0.1)
Basophil %: 0.3 %
Eos # K/uL: 0.1 10*3/uL (ref 0.0–0.5)
Eosinophil %: 1.2 %
Hematocrit: 44 % (ref 40–51)
Hemoglobin: 15.2 g/dL (ref 13.7–17.5)
IMM Granulocytes #: 0 10*3/uL
IMM Granulocytes: 0.2 %
Lymph # K/uL: 2.4 10*3/uL (ref 1.3–3.6)
Lymphocyte %: 27.6 %
MCH: 30 pg/cell (ref 26–32)
MCHC: 35 g/dL (ref 32–37)
MCV: 85 fL (ref 79–92)
Mono # K/uL: 0.4 10*3/uL (ref 0.3–0.8)
Monocyte %: 4.9 %
Neut # K/uL: 5.7 10*3/uL — ABNORMAL HIGH (ref 1.8–5.4)
Nucl RBC # K/uL: 0 10*3/uL (ref 0.0–0.0)
Nucl RBC %: 0 /100 WBC (ref 0.0–0.2)
Platelets: 264 10*3/uL (ref 150–330)
RBC: 5.1 MIL/uL (ref 4.6–6.1)
RDW: 13 % (ref 11.6–14.4)
Seg Neut %: 65.8 %
WBC: 8.6 10*3/uL (ref 4.2–9.1)

## 2018-04-21 LAB — UNMAPPED LAB RESULTS
Basophil # (HT): 0 10 3/uL — NL (ref 0.0–0.1)
Basophil % (HT): 0 % — NL (ref 0–3)
Eosinophil # (HT): 0.2 10 3/uL — NL (ref 0.0–0.7)
Eosinophil % (HT): 1 % — NL (ref 0–5)
Hematocrit (HT): 41 % — ABNORMAL LOW (ref 43–51)
Hemoglobin (HGB) (HT): 14.5 g/dL — NL (ref 13.8–17.9)
Lymphocyte # (HT): 3.3 10 3/uL — NL (ref 0.6–3.3)
Lymphocyte % (HT): 24 % — NL (ref 15–45)
MCHC (HT): 35.3 g/dL — ABNORMAL HIGH (ref 31.8–34.9)
MCV (HT): 83 fL — NL (ref 80–97)
Mean Corpuscular Hemoglobin (MCH) (HT): 29.3 pg — NL (ref 26.6–30.7)
Mean Platelet Volume (HT): 10.1 fL — NL (ref 8.9–12.3)
Monocyte # (HT): 1 10 3/uL — NL (ref 0.1–1.1)
Monocyte % (HT): 7 % — NL (ref 0–15)
Neutrophil # (HT): 9.6 10 3/uL — ABNORMAL HIGH (ref 2.0–7.2)
Platelets (HT): 291 10 3/uL — NL (ref 142–414)
RBC (HT): 4.95 10 6/uL — NL (ref 4.69–6.09)
RDW (HT): 13.3 % — NL (ref 12.9–16.0)
Seg Neut % (HT): 68 % — NL (ref 45–75)
WBC (HT): 14 10 3/uL — ABNORMAL HIGH (ref 4.7–10.6)

## 2018-04-21 LAB — URINALYSIS REFLEX TO CULTURE
Glucose,UA: NEGATIVE mg/dL
Ketones, UA: NEGATIVE mg/dL
Nitrite,UA: NEGATIVE
Protein,UA: NEGATIVE mg/dL
Specific Gravity,UA: 1.022 (ref 1.002–1.030)
pH,UA: 7 (ref 5.0–8.0)

## 2018-04-21 LAB — URINE MICROSCOPIC (IQ200): RBC,UA: >50 /hpf — AB (ref 0–2)

## 2018-04-21 LAB — LIPASE: Lipase: 20 U/L (ref 13–60)

## 2018-04-21 LAB — HM HIV SCREENING OFFERED

## 2018-04-21 MED ORDER — SODIUM CHLORIDE 0.9 % IV BOLUS *I*
1000.0000 mL | Freq: Once | Status: AC
Start: 2018-04-21 — End: 2018-04-21
  Administered 2018-04-21: 1000 mL via INTRAVENOUS

## 2018-04-21 MED ORDER — KETOROLAC TROMETHAMINE 10 MG PO TABS *I*
10.0000 mg | ORAL_TABLET | Freq: Four times a day (QID) | ORAL | 0 refills | Status: AC | PRN
Start: 2018-04-21 — End: 2018-04-28

## 2018-04-21 MED ORDER — TAMSULOSIN HCL 0.4 MG PO CAPS *I*
0.4000 mg | ORAL_CAPSULE | Freq: Once | ORAL | Status: AC
Start: 2018-04-21 — End: 2018-04-21
  Administered 2018-04-21: 0.4 mg via ORAL
  Filled 2018-04-21: qty 1

## 2018-04-21 MED ORDER — TAMSULOSIN HCL 0.4 MG PO CAPS *I*
0.4000 mg | ORAL_CAPSULE | Freq: Every evening | ORAL | 0 refills | Status: AC
Start: 2018-04-21 — End: 2018-05-21

## 2018-04-21 MED ORDER — KETOROLAC TROMETHAMINE 30 MG/ML IJ SOLN *I*
30.0000 mg | Freq: Once | INTRAMUSCULAR | Status: AC
Start: 2018-04-21 — End: 2018-04-21
  Administered 2018-04-21: 30 mg via INTRAVENOUS
  Filled 2018-04-21: qty 1

## 2018-04-21 NOTE — ED Provider Notes (Signed)
History     Chief Complaint   Patient presents with    Flank Pain     32 year old male presents with left flank pain that started on Friday.  Patient was seen at [facility] and diagnosed with a 3.5 mm kidney stone at the left UVJ.  Patient was given a prescription for Flomax and a urine strainer and advised to return if the pain got worse.  Patient has prior history of kidney stones last August and required urology intervention.  He denies recent fevers, chills but reports increased diaphoresis.  No chest pain, shortness of breath, nausea, vomiting or diarrhea.  Patient has pain in the left flank that radiates down into his left lower abdomen.  He reports increased urinary urgency and frequency.  He also notes that his urine appears "dark in color". He has been eating and drinking normally.  Patient was given a prescription for Flomax but did not fill the prescription.  Pain has gone progressively worse since Friday and he is concerned about possible obstruction.            Medical/Surgical/Family History     Past Medical History:   Diagnosis Date    Herniated cervical disc     Kidney stone         There is no problem list on file for this patient.           Past Surgical History:   Procedure Laterality Date    APPENDECTOMY      Left knee arthroscopy       No family history on file.       Social History   Substance Use Topics    Smoking status: Current Every Day Smoker     Packs/day: 0.50    Smokeless tobacco: Never Used      Comment: vape    Alcohol use Yes     Living Situation     Questions Responses    Patient lives with Parent(s)    Homeless No    Caregiver for other family member     External Services     Employment     Domestic Violence Risk                 Review of Systems   Review of Systems   Constitutional: Positive for diaphoresis. Negative for appetite change, chills, fatigue and fever.   Respiratory: Negative for cough, chest tightness and shortness of breath.    Cardiovascular:  Negative for chest pain.   Gastrointestinal: Positive for abdominal pain. Negative for constipation, diarrhea, nausea and vomiting.   Genitourinary: Positive for difficulty urinating, flank pain, frequency and urgency. Negative for dysuria, hematuria, penile swelling, scrotal swelling and testicular pain.   Musculoskeletal: Negative for myalgias.   Neurological: Negative for weakness, light-headedness, numbness and headaches.   All other systems reviewed and are negative.      Physical Exam     Triage Vitals  Triage Start: Start, (04/21/18 1056)   First Recorded BP: 137/76, Resp: 16, Temp: 36.4 C (97.5 F), Temp src: TEMPORAL Oxygen Therapy SpO2: 100 %, O2 Device: None (Room air), Heart Rate: 93, (04/21/18 1057)  .  First Pain Reported  0-10 Scale: 3, (04/21/18 1057)       Physical Exam   Constitutional: He is oriented to person, place, and time. He appears well-developed and well-nourished.   HENT:   Head: Normocephalic and atraumatic.   Eyes: Pupils are equal, round, and reactive to light. EOM are  normal.   Neck: Normal range of motion. Neck supple.   Cardiovascular: Normal rate, regular rhythm, normal heart sounds and intact distal pulses.    Pulmonary/Chest: Effort normal and breath sounds normal.   Abdominal: Soft. Normal appearance and bowel sounds are normal. There is tenderness in the left lower quadrant. There is CVA tenderness (left).   Musculoskeletal: Normal range of motion.   Neurological: He is alert and oriented to person, place, and time.   Skin: Skin is warm and dry.   Psychiatric: He has a normal mood and affect.   Nursing note and vitals reviewed.      Medical Decision Making        Initial Evaluation:  ED First Provider Contact     Date/Time Event User Comments    04/21/18 1058 ED First Provider Contact Pilar JarvisPETERSEN, Avelino Herren Initial Face to Face Provider Contact          Patient seen by me on arrival date of 04/21/2018.    Assessment:  31 y.o.male comes to the ED with L flank pain with positive kidney  [facility]. Pain has gotten progressively worse.       Differential Diagnosis includes: pyelonephritis, obstructed stone, ureterolithiasis, UTI    Plan:   Orders Placed This Encounter   Procedures    Urinalysis reflex to culture    Portable US renal retroperitoneal complete    CBC and differential    Comprehensive metabolic panel    Lipase    Urinalysis with reflex to Microscopic UA and reflex to Bacterial Culture    HM HIV SCREENING OFFERED    Insert peripheral IV       ED Course as of Apr 21 1413   Mon Apr 21, 2018   1203 Unlikely pyelonephritis with no leukocytosis.  WBC: 8.6   1203 Awaiting renal USN.  Blood,UA: (!) 3+   1218 RBC,UA: (!) >50   1339 6 mm calculus at the left UVJ, almost in the bladder with mild to minimal left-sided hydroureteronephrosis.  CT abdomen and pelvis without contrast   1340 Discussed case with Dorothea GlassmanJamie Connor. Will trial passage and discharge.       Rx flomax and ketorolac.     Discussed case with Dr. Tobe Sosardina . Provider agrees with work up and treatment plan.     [address]Adv[address] Buice Helene Rosaline Ezekiel, PA          Pilar Jarvisetersen, Demeco Ducksworth [place], [place]  04/21/18 1414

## 2018-04-21 NOTE — ED Triage Notes (Signed)
Left flank pain toward lower abd freq need to urinate and goes in small amts hx of kidney stones in the past started fri got some relief pain returned today       Triage Note   Harrie JeansPatty Edla Para, RN

## 2018-04-21 NOTE — Discharge Instructions (Signed)
Use urine strainer.   Flomax daily at night before bed.   Ketorolac 4 times daily along with tylenol as needed.   Push fluids.   Return with fevers, vomiting or severe pain.   Call urology for follow up in 1-2 weeks.

## 2018-04-22 ENCOUNTER — Ambulatory Visit: Payer: Self-pay | Admitting: Anesthesiology

## 2018-04-22 ENCOUNTER — Encounter: Payer: Self-pay | Admitting: Urology

## 2018-04-22 ENCOUNTER — Ambulatory Visit
Admission: RE | Admit: 2018-04-22 | Discharge: 2018-04-22 | Disposition: A | Discharge status: Home / Self Care | Payer: Self-pay | Source: Ambulatory Visit | Attending: Urology | Admitting: Urology

## 2018-04-22 ENCOUNTER — Ambulatory Visit: Payer: Self-pay

## 2018-04-22 ENCOUNTER — Encounter: Payer: Self-pay | Admitting: Anesthesiology

## 2018-04-22 ENCOUNTER — Encounter
Admission: RE | Disposition: A | Discharge status: Home / Self Care | Payer: Self-pay | Source: Ambulatory Visit | Attending: Urology

## 2018-04-22 ENCOUNTER — Telehealth: Payer: Self-pay | Admitting: Urology

## 2018-04-22 DIAGNOSIS — N200 Calculus of kidney: Secondary | ICD-10-CM

## 2018-04-22 DIAGNOSIS — N201 Calculus of ureter: Secondary | ICD-10-CM | POA: Insufficient documentation

## 2018-04-22 DIAGNOSIS — Z960 Presence of urogenital implants: Secondary | ICD-10-CM | POA: Insufficient documentation

## 2018-04-22 HISTORY — PX: PR CYSTOSCOPY,INSERT URETHRAL STENT: 52282

## 2018-04-22 HISTORY — PX: PR CYSTOURETHROSCOPY INSERTION PERM URETHRAL STENT: 52282

## 2018-04-22 HISTORY — PX: PR CYSTO W/URTROSCOPY&/PYELOSCOPY DX: 52351

## 2018-04-22 HISTORY — PX: PR CYSTO/URETERO/PYELOSCOPY, DX: 52351

## 2018-04-22 SURGERY — CYSTOSCOPY, WITH URETERAL STENT INSERTION
Anesthesia: General | Site: Ureter | Laterality: Left | Wound class: Clean Contaminated

## 2018-04-22 MED ORDER — DEXAMETHASONE SODIUM PHOSPHATE 4 MG/ML INJ SOLN *WRAPPED*
INTRAMUSCULAR | Status: DC | PRN
Start: 2018-04-22 — End: 2018-04-22
  Administered 2018-04-22: 10 mg via INTRAVENOUS

## 2018-04-22 MED ORDER — HYDROMORPHONE HCL PF 0.5 MG/0.5 ML IJ SOLN *I*
0.5000 mg | INTRAMUSCULAR | Status: DC | PRN
Start: 2018-04-22 — End: 2018-04-23

## 2018-04-22 MED ORDER — FENTANYL CITRATE 50 MCG/ML IJ SOLN *WRAPPED*
INTRAMUSCULAR | Status: DC | PRN
Start: 2018-04-22 — End: 2018-04-22
  Administered 2018-04-22: 100 ug via INTRAVENOUS

## 2018-04-22 MED ORDER — PROPOFOL 10 MG/ML IV EMUL (INTERMITTENT DOSING) WRAPPED *I*
INTRAVENOUS | Status: AC
Start: 2018-04-22 — End: 2018-04-22
  Filled 2018-04-22: qty 20

## 2018-04-22 MED ORDER — PROPOFOL 10 MG/ML IV EMUL (INTERMITTENT DOSING) WRAPPED *I*
INTRAVENOUS | Status: DC | PRN
Start: 2018-04-22 — End: 2018-04-22
  Administered 2018-04-22: 200 mg via INTRAVENOUS

## 2018-04-22 MED ORDER — MORPHINE SULFATE 2 MG/ML IV SOLN *WRAPPED*
2.0000 mg | Status: DC | PRN
Start: 2018-04-22 — End: 2018-04-22

## 2018-04-22 MED ORDER — HYDROMORPHONE HCL PF 1 MG/ML IJ SOLN *WRAPPED*
INTRAMUSCULAR | Status: DC | PRN
Start: 2018-04-22 — End: 2018-04-22
  Administered 2018-04-22: 1 mg via INTRAVENOUS

## 2018-04-22 MED ORDER — LACTATED RINGERS IV SOLN *I*
125.0000 mL/h | INTRAVENOUS | Status: DC
Start: 2018-04-22 — End: 2018-04-23

## 2018-04-22 MED ORDER — CEFAZOLIN 2000 MG IN STERILE WATER 20ML SYRINGE *I*
PREFILLED_SYRINGE | INTRAVENOUS | Status: AC
Start: 2018-04-22 — End: 2018-04-22
  Filled 2018-04-22: qty 20

## 2018-04-22 MED ORDER — IOHEXOL 300 MG/ML (OMNIPAQUE) IV SOLN *I*
INTRAMUSCULAR | Status: DC | PRN
Start: 2018-04-22 — End: 2018-04-22
  Administered 2018-04-22: 10 mL

## 2018-04-22 MED ORDER — ONDANSETRON HCL 2 MG/ML IV SOLN *I*
4.0000 mg | Freq: Once | INTRAMUSCULAR | Status: AC | PRN
Start: 2018-04-22 — End: 2018-04-22

## 2018-04-22 MED ORDER — OXYBUTYNIN CHLORIDE 5 MG PO TB24 *I*
10.0000 mg | ORAL_TABLET | Freq: Once | ORAL | Status: AC | PRN
Start: 2018-04-22 — End: 2018-04-22

## 2018-04-22 MED ORDER — LIDOCAINE HCL 2 % IJ SOLN *I*
INTRAMUSCULAR | Status: DC | PRN
Start: 2018-04-22 — End: 2018-04-22
  Administered 2018-04-22: 100 mg via INTRAVENOUS

## 2018-04-22 MED ORDER — KETOROLAC TROMETHAMINE 30 MG/ML IJ SOLN *I*
INTRAMUSCULAR | Status: AC
Start: 2018-04-22 — End: 2018-04-22
  Filled 2018-04-22: qty 1

## 2018-04-22 MED ORDER — METOCLOPRAMIDE HCL 5 MG/ML IJ SOLN *I*
INTRAMUSCULAR | Status: AC
Start: 2018-04-22 — End: 2018-04-22
  Filled 2018-04-22: qty 2

## 2018-04-22 MED ORDER — HYDROMORPHONE HCL PF 1 MG/ML IJ SOLN *WRAPPED*
INTRAMUSCULAR | Status: AC
Start: 2018-04-22 — End: 2018-04-22
  Filled 2018-04-22: qty 1

## 2018-04-22 MED ORDER — DEXAMETHASONE SODIUM PHOSPHATE 10 MG/ML IJ SOLN *I*
INTRAMUSCULAR | Status: AC
Start: 2018-04-22 — End: 2018-04-22
  Filled 2018-04-22: qty 1

## 2018-04-22 MED ORDER — CEFAZOLIN 2000 MG IN STERILE WATER 20ML SYRINGE *I*
2000.0000 mg | PREFILLED_SYRINGE | Freq: Once | INTRAVENOUS | Status: AC
Start: 2018-04-22 — End: 2018-04-22
  Administered 2018-04-22: 2000 mg via INTRAVENOUS

## 2018-04-22 MED ORDER — ONDANSETRON HCL 2 MG/ML IV SOLN *I*
INTRAMUSCULAR | Status: DC | PRN
Start: 2018-04-22 — End: 2018-04-22
  Administered 2018-04-22: 4 mg via INTRAVENOUS

## 2018-04-22 MED ORDER — LIDOCAINE HCL 2 % (PF) IJ SOLN *I*
0.1000 mL | INTRAMUSCULAR | Status: DC | PRN
Start: 2018-04-22 — End: 2018-04-23

## 2018-04-22 MED ORDER — MIDAZOLAM HCL 1 MG/ML IJ SOLN *I* WRAPPED
INTRAMUSCULAR | Status: DC | PRN
Start: 2018-04-22 — End: 2018-04-22
  Administered 2018-04-22: 2 mg via INTRAVENOUS

## 2018-04-22 MED ORDER — LIDOCAINE HCL 2 % (PF) IJ SOLN *I*
INTRAMUSCULAR | Status: AC
Start: 2018-04-22 — End: 2018-04-22
  Filled 2018-04-22: qty 5

## 2018-04-22 MED ORDER — LIDOCAINE HCL 2 % EX JELLY WRAPPED *I*
Freq: Once | CUTANEOUS | Status: AC | PRN
Start: 2018-04-22 — End: 2018-04-22

## 2018-04-22 MED ORDER — KETOROLAC TROMETHAMINE 30 MG/ML IJ SOLN *I*
INTRAMUSCULAR | Status: DC | PRN
Start: 2018-04-22 — End: 2018-04-22
  Administered 2018-04-22: 30 mg via INTRAVENOUS

## 2018-04-22 MED ORDER — FENTANYL CITRATE 50 MCG/ML IJ SOLN *WRAPPED*
INTRAMUSCULAR | Status: AC
Start: 2018-04-22 — End: 2018-04-22
  Filled 2018-04-22: qty 2

## 2018-04-22 MED ORDER — PHENAZOPYRIDINE HCL 100 MG PO TABS *I*
100.0000 mg | ORAL_TABLET | Freq: Three times a day (TID) | ORAL | Status: DC | PRN
Start: 2018-04-22 — End: 2018-04-23

## 2018-04-22 MED ORDER — PROMETHAZINE HCL 25 MG/ML IJ SOLN *I*
12.5000 mg | Freq: Once | INTRAMUSCULAR | Status: AC | PRN
Start: 2018-04-22 — End: 2018-04-22

## 2018-04-22 MED ORDER — MIDAZOLAM HCL 1 MG/ML IJ SOLN *I* WRAPPED
INTRAMUSCULAR | Status: AC
Start: 2018-04-22 — End: 2018-04-22
  Filled 2018-04-22: qty 2

## 2018-04-22 MED ORDER — METOCLOPRAMIDE HCL 5 MG/ML IJ SOLN *I*
10.0000 mg | Freq: Once | INTRAMUSCULAR | Status: AC | PRN
Start: 2018-04-22 — End: 2018-04-22
  Administered 2018-04-22: 10 mg via INTRAVENOUS

## 2018-04-22 MED ORDER — OXYBUTYNIN CHLORIDE 5 MG PO TB24 *I*
5.0000 mg | ORAL_TABLET | Freq: Once | ORAL | Status: AC | PRN
Start: 2018-04-22 — End: 2018-04-22

## 2018-04-22 MED ORDER — ONDANSETRON HCL 2 MG/ML IV SOLN *I*
INTRAMUSCULAR | Status: AC
Start: 2018-04-22 — End: 2018-04-22
  Filled 2018-04-22: qty 2

## 2018-04-22 MED ORDER — LACTATED RINGERS IV SOLN *I*
50.0000 mL/h | INTRAVENOUS | Status: DC
Start: 2018-04-22 — End: 2018-04-23
  Administered 2018-04-22: 50 mL/h via INTRAVENOUS

## 2018-04-22 MED ORDER — ONDANSETRON 4 MG PO TBDP *I*
4.0000 mg | ORAL_TABLET | Freq: Once | ORAL | Status: AC | PRN
Start: 2018-04-22 — End: 2018-04-22

## 2018-04-22 SURGICAL SUPPLY — 23 items
BAG DRAIN UROLOGY HOOP 5442 (Supply) ×3 IMPLANT
BASKET SKYLITE 2.4F 42400 (Supply) ×2 IMPLANT
CATH URET FLEX TIP 5F  TIGERTAIL 139005 (Supply) ×2 IMPLANT
CONTRAST OMNIPAQUE 300 50ML (Supply) ×3 IMPLANT
DRAPE C ARM 41X140 DEROYAL (Drape) ×3 IMPLANT
GLOVE BIOGEL PI ULTRATOUCH 7.0 PF (Glove) ×9 IMPLANT
GLOVE PI BIOGEL MICRO UNDERGLOVE  7.0 (Glove) ×3 IMPLANT
GOWN SURG ASTOUND XL (Gown) ×4 IMPLANT
GUIDEWIRE BENTSON 12BX (Guidewire) ×3 IMPLANT
GUIDEWIRE SENSOR.035 670-308 (Guidewire) ×3 IMPLANT
MANIFOLD ULTRA CART DORNOCH (Supply) ×3 IMPLANT
MARKER SKIN SURGICAL STERILE (Supply) ×3 IMPLANT
PACK CYSTOSCOPY (Pack) ×3 IMPLANT
SLEEVE SCD DVT MED (Supply) ×3 IMPLANT
SOL IRR NORMAL SALINE 3000ML (Solution) ×3 IMPLANT
SOL IRR NORMAL SALINE 500ML (Solution) ×3 IMPLANT
SPONGE NONWOVEN TRAY 4X4 (Dressing) ×3 IMPLANT
STENT INLAY OPTIMA 4.7F 26CM 788426 (Stent (Non-Cardiac)) ×2 IMPLANT
SUCTION YANKAUER W/O CONTROL (Supply) ×3 IMPLANT
SYRINGE 30CC (Syringe) ×3 IMPLANT
TOWEL OR 4 PK DISP (Supply) ×3 IMPLANT
TUBE CONNECTING UNSTERILE (Supply) ×3 IMPLANT
TUBE IRR CYSTO (Supply) ×3 IMPLANT

## 2018-04-22 NOTE — Discharge Instructions (Signed)
[facility] PATIENT DISCHARGE PLAN    FOR AMBULATORY SURGICAL PATIENTS:  You have received sedative medication and/or general anesthesia which may make you drowsy for as long as 24 hours.     A.) DO NOT drive or operate any machinery for 24 hours    B.) DO NOT drink alcoholic beverages for 24 hours    C.) DO NOT make major decisions, sign contracts, etc. for 24 hours    DIET: Resume your previous diet    Recommended activity: Walking is encouraged  Stairs are encouraged  Do NOT: Drive a car, drink alcohol, operate machinery, or make important decisions while on narcotics  No heavy lifting (>10lbs) or strenuous activity    Pain Management: Acetaminophen - Can be taken for pain (mild-moderate)  Do not take any Tylenol or Tylenol (acetaminophen) products if taking other Tylenol containing products such as Norco or Percocet your pain medication, you may take tylenol instead of a dose of Norco or Percocet.  Toradol is an NSAID - can be taken for pain (mild-moderate) Can be used interchangeably with tylenol for pain  Tramadol- is an Opioid and can be taken if pain in not well controlled with tylenol/ibuprofen    Flexeril- is a muscle relaxant that will help with spasms    Pyridium-for burning with urination, this will turn your urine orange and stain if the urine is spilled.     Can also use heat/cold packs for pain relief    PAIN MANAGEMENT RECOMMENDATION: Assess your level of pain on a scale from 0 to 10.      You should take narcotic pain medication if your pain level is greater than five (5) - be sure to eat prior to taking the medication. If your physician has prescribed non-narcotic pain medication, please take as directed.    Other Instructions:   -Avoid constipation. Take a stool softener (Colace). Take a laxative if necessary (Milk of Magnesia or Miralax).  -Watch the color of your urine. It is normal to see blood in your urine for up to 6 weeks after your surgery. If the urine becomes pink, increase the  amount of fluids you drink.  -Stay hydrated- it is recommended that you drink 6-8, 8 oz glasses of water/day    YOU RECEIVED A DOSE OF TORADOL THROUGH YOUR IV AT 5:20 PM- YOU ARE NOT DUE TO TAKE THIS MEDICATION AGAIN UNTIL MIDNIGHT 03/24/2018 @ midnight    FOLLOW-UP CARE    Call your doctor for the following problems: FEVER over 101 F/38.4 C; PAIN not relieved with pain medication ordered; heavy BLEEDING, foul DRAINAGE, INABILITY TO URINATE. Or any other major medical concern.    If this is an emergency, call 911.

## 2018-04-22 NOTE — Telephone Encounter (Signed)
Returned call to patient.  [facility] d/t pressure in pt states left flank.  Pt states intermittent stabbing pain.  [facility] and then felt ok as pt describes was relaxed.  Last evening pain and pressure returned and [facility].  Pt states has had 3 CT scans.  Most recent showing 6 mm stone:    Study Result     04/21/2018 1:21 PM    CT ABDOMEN AND PELVIS WITHOUT CONTRAST    CLINICAL INFORMATION:  Flank pain, recurrent stone disease suspected;  ;     COMPARISON:  None.    PROCEDURE:  Contiguous images were obtained through the abdomen and pelvis without intravenous contrast.  Automated exposure control, adjustment of the mA and/or kV according to patient size, and/or iterative reconstruction techniques were utilized for   radiation dose optimization.    FINDINGS:    Lung Bases: Images from the lower chest reveal patchy and linear densities in the bibasilar lungs likely a combination of atelectasis and/or scarring.    Stomach: Normal.    Liver: Homogenous. No focal lesions identified.    Spleen: Homogenous. No focal lesions identified.    Adrenal Glands: Normal.    Gallbladder and Bile Ducts: Normal.    Pancreas: Normal.    Right Kidney: Punctate 2 mm nonobstructing calculus in the lower pole and likely 1 mm nonobstructing calculus in the upper pole. Minimal fullness in the pelvis without hydronephrosis.    Left Kidney: 6 mm calculus at the UVJ, almost in the bladder with mild to minimal left-sided hydroureteronephrosis.     Vasculature: Minimal atherosclerotic calcification.    Small Bowel: The small bowel is not distended.    Colon: No frank colonic diverticulosis or evidence for acute diverticulitis.    Lymph Nodes: No adenopathy by imaging size criteria.     Bladder: 6 cm calculus at the left UVJ almost in the bladder.    Prostate: Normal.    Soft Tissues: Normal.    Bones: Normal.    Miscellaneous: No free fluid or ascites.      IMPRESSION:  6 mm  calculus at the left UVJ, almost in the bladder with mild to minimal left-sided hydroureteronephrosis.     Tiny right-sided nonobstructing calculi.  END OF IMPRESSION    [facility] submits this DICOM format image data and final report to the [facility], an independent secure electronic health information exchange, on a reciprocally searchable basis (with patient authorization) for a minimum of 12 months after exam   date.   Imaging     CT abdomen and pelvis without contrast (Order #[id number]) on 04/21/2018 - Imaging Information    Pt denies fever, chills, vomiting.  Pt pain 3/10  Pt is now taking Tamsulosin  Pt states was given Ketorolac and that felt amazing per pt.  Color of urine is yellow and cloudy  Pt states drinking 4-5 bottles of water per day.  Notified patient should continue the Tamsulosin  Increase water to 8 bottles per day.  Pt states results of where the stone is has been the same for the last 3 CT scans.  Pt states I need something to help the pain and pressure.  Notified pt will share with covering Provider and return call.

## 2018-04-22 NOTE — Telephone Encounter (Signed)
Spoke to pt.  [facility], found to have 6mm obstructing stone at the left UVJ with mild-mod hydronephrosis.  No there left sided calculi noted.  Pt was hemodynamically stable, afebrile, his renal function was stable with a creatinine of 0.80 GFR 120, he was without leukocytosis his UA with only trace amounts of pyuria, 0-5 WBC's. He was discharged home with medical management for TOP.    At this time pt continues to have left renal colic, reports pain is constant 3/10 with periods of worsening intensity, at worst 7-10/10, waves of nausea associated with worsening pain.  Also reports frequency with small volume voids, slight, intermittent dysuria and gross hematuria.  Denies fever, chills, vomiting, constipation, diarrhea.      He has been taking P.O toradol without relief.  He does have tramadol prescribed that he has not taken.    Discussed options including continuing trial of passage using tramadol, toradol and flomax vs OR this afternoon for left ureteral stent vs U scope for definitive treatment.  Patient would like to proceed with the OR at this time.      Case discussed with Dr. Schuyler AmorWachterman.  Writer working currently working on OR arrangements.

## 2018-04-22 NOTE — Telephone Encounter (Signed)
[facility]   Cysto; left ureteroscopy w/without laser, left ureteral stent placement, RGP       No solids since last evening,  H2o -1/2 bottle throughout the course of this am, last sip at 11:15 am.      Returned call to pt, advised him to present to [facility] at 3:30 pm, that he is to have nothing else to eat or drink and that he will need a driver to bring him home following OR.      Furthermore, he was reminded that the plan will be for cysto, left U scope, w/without laser and left ureteral stent however if OR availability does not allow d/t emergent case of there are signs of infected efflux from left ureter once inside bladder we will not be able to perform u scope and therefore case would be cysto with ureteral stent and he will have to return at a later date for definitive treatment. \    Pt verbalized understanding.

## 2018-04-22 NOTE — Anesthesia Postprocedure Evaluation (Signed)
Anesthesia Post-Op Note    Patient: [name]    Procedure(s) Performed:  Procedure Summary  Date:  04/22/2018 Anesthesia Start: 04/22/2018  4:41 PM Anesthesia Stop: 04/22/2018  5:28 PM Room / Location:  [id number] / [facility] MAIN OR   Procedure(s):  CYSTOSCOPY URETERAL STENT  URETEROSCOPY WITH HOLMIUM LASER Diagnosis:  Kidney stone on left side [N20.0] Surgeon(s):  Schuyler AmorWachterman, Judithann GravesJared Behn, MD Attending Anesthesiologist:  Ulyess BlossomELLIS, Hildred Pharo, MD         Recovery Vitals  BP: 108/64 (04/22/2018  5:25 PM)  Heart Rate: 84 (04/22/2018  5:25 PM)  Heart Rate (via Pulse Ox): 74 (04/21/2018  1:48 PM)  Resp: 22 (04/22/2018  5:30 PM)  Temp: 36.2 C (97.2 F) (04/22/2018  5:25 PM)  SpO2: 98 % (04/22/2018  5:25 PM)  O2 Flow Rate: 3 L/min (04/22/2018  5:25 PM)   0-10 Scale: 0 (04/22/2018  3:42 PM)  Anesthesia type:  General  Complications Noted During Procedure or in PACU:  None   Comment:    Patient Location:  PACU  Level of Consciousness:    Recovered to baseline  Patient Participation:     Able to participate  Temperature Status:    Normothermic  Oxygen Saturation:    Within patient's normal range  Cardiac Status:   Within patient's normal range  Fluid Status:    Stable  Airway Patency:     Yes  Pulmonary Status:    Baseline  Pain Management:    Adequate analgesia  Nausea and Vomiting:    Controlled    Post Op Assessment:    Tolerated procedure well"   Attending Attestation:  All indicated post anesthesia care provided       -

## 2018-04-22 NOTE — Anesthesia Case Conclusion (Signed)
CASE CONCLUSION  Emergence  Actions:  Suctioned, OPA and LMA removed  Criteria Used for Airway Removal:  Acceptable O2 saturation  Assessment:  Routine  Transport  Directly to: PACU  Position:  Recumbent  Patient Condition on Handoff  Level of Consciousness:  Mildly sedated  Patient Condition:  Stable  Handoff Report to:  RN

## 2018-04-22 NOTE — Telephone Encounter (Signed)
[name] calling to report that he is experiencing kidney stone intermittent pain at a 3 out of 10. These symptoms have been going on for 5 day(s).  He stated for the past 3 days the stone has been on the edge of his urethra and his bladder and won't move.  Patient also stated he's been back and forth to emergency for 5 days.     Patient requesting same day appointment? yes, patient stated he have to be seen today and is willing to be seen at any location.  Patient requesting call back? yes    Phone number confirmed at [phone]

## 2018-04-22 NOTE — Invasive Procedure Plan of Care (Addendum)
Invasive Procedure Plan of Care (Consent Form 419):   Condition(s) Addressed: Ureteral/kidney stone   Performing Provider: Beau Fanny BEHN   Side: Left    Procedure: Cystoscopy, ureteral stent placement; retrograde pyelogram. Possible ureteroscopy with/without laser lithotripsy   Special Equipment:    Planned Anesthesia: General   Benefits: Possible removal of stone, pain relief, removal of obstruction   Risks: Pain, stent colic, bleeding; infection; damage to urethra, prostate, ureter, bladder or kidney; stricture of urethra or ureter - risk of injuring ureter that may require external or internal drainage or open surgical repair; inability to reach stone or stone fragment; failure to diagnose or cure condition. Risk of anesthesia; heart attack; stroke; blood clot in legs, lungs. Need for additional procedures   Alternatives: No surgery, medical management, nephrostomy tube placement   Expected Length of Stay: 0 day(s)     I, or a designated member of my surgical team, have discussed the planned procedure, including the potential for any transfusion of blood products or receipt of tissue as necessary, expected benefits, the potential complications and risks and possible alternatives and their benefits and risks with the patient or the patient's surrogate. In my opinion, the patent or the patient's surrogate understands the proposed procedure, its risks, benefits, and alternatives.    Electronically signed by Ancil Boozer, MD at 2:49 PM     Patient Consent:  I hereby give my consent and authorize Beau Fanny [facility]  (The list of possible assistants, all of whom are privileged to provide surgical services at the hospital, is available)  To treat the following: Ureteral/kidney stone  Procedure includes: Cystoscopy, ureteral stent placement; retrograde pyelogram. Possible ureteroscopy with/without laser lithotripsy  Laterality: Left  1 The care provider has explained my condition to me, the benefits  of having the above treatment procedure, and alternate ways of treating my condition. I understand that no guarantees have been made to me about the result of the treatment. The alternatives to this procedure include: No surgery, medical management, nephrostomy tube placement   2 The care provider has discussed with me the reasonably foreseeable risks of the treatment and that there may be undesirable results. The risks that are specifically related to this procedure include: Pain, stent colic, bleeding; infection; damage to urethra, prostate, ureter, bladder or kidney; stricture of urethra or ureter - risk of injuring ureter that may require external or internal drainage or open surgical repair; inability to reach stone or stone fragment; failure to diagnose or cure condition. Risk of anesthesia; heart attack; stroke; blood clot in legs, lungs. Need for additional procedures   3 I understand that during the treatment a condition may be discovered which was not known before the treatment started. Therefore, I authorize the care provider to perform any additional or different treatment which is thought necessary and available.   4 Any tissue, parts, or substances removed during the procedure may be retained or disposed of in accordance with customary scientific, educational and clinical practice.   5 Vendor information if appropriate:    6 If blood products are needed, I would agree: N/A   7 If tissue products are needed, I would agree: N/A    8 Blood/Tissue use limitations and/or exclusions:       I have carefully read and fully understand this informed consent form, and have had sufficient opportunity to discuss my condition and the above procedure(s) with the care provider and his/her associates, and all of my questions have been answered to my  satisfaction. I understand that my surgeon/provider performing the procedure may not be physically present in the operating/procedure room the entire time that I am there.  My surgeon/provider has answered my questions regarding this and how it may relate to my surgery/procedure. I agree to the Plan of Care as outlined above.     Patient Consent for Blood/Tissue: Not applicable.                  Patient Signature   (or Parent/Legal Guardian if pt is unable to sign or is a minor)  Date/Time     Electronic Signatures will display at the bottom of the consent form.

## 2018-04-22 NOTE — Anesthesia Procedure Notes (Signed)
---------------------------------------------------------------------------------------------------------------------------------------    AIRWAY   GENERAL INFORMATION AND STAFF    Patient location during procedure: OR       Date of Procedure: 04/22/2018 4:49 PM  AIRWAY METHOD     Patient Position:  Sniffing    Preoxygenated: yes      Induction: IV and Routine  Mask Difficulty Assessment:  1 - vent by mask    Number of Attempts at Approach:  1  FINAL AIRWAY DETAILS    Final Airway Type:  LMA    Final LMA: classic    LMA Size: 5  ----------------------------------------------------------------------------------------------------------------------------------------

## 2018-04-22 NOTE — Anesthesia Preprocedure Evaluation (Signed)
Anesthesia Pre-operative History and Physical for Lavina HammanKyle Gemma  .  .  .  Anesthesia Evaluation Information Source: patient         GENERAL    + Obesity          upper body, central, lower body     PULMONARY    + Smoker        GI/HEPATIC/RENAL  Last PO Intake: Enter Last PO Intake in ROS/Med Hx Tab                Physical Exam Not Completed________________________________________________________________________  PLAN  ASA Score  2  emergent  Anesthetic Plan general     Induction (routine IV) General Anesthesia/Sedation Maintenance Plan (inhaled agents); Airway (LMA); Line ( use current access); Monitoring (standard ASA); Positioning (supine); Pain (per surgical team); PostOp (PACU)    Informed Consent     Risks:          Risks discussed were commensurate with the plan listed above with the following specific points: N/V, aspiration, sore throat, dizziness and unsteadiness , damage to:(eyes, teeth), unexpected serious injury    Anesthetic Consent:         Anesthetic plan (and risks as noted above) were discussed with patient    Plan also discussed with team members including:       CRNA    Attending Attestation:  As the primary attending anesthesiologist, I attest that the patient or proxy understands and accepts the risks and benefits of the anesthesia plan. I also attest that I have personally performed a pre-anesthetic examination and evaluation, and prescribed the anesthetic plan for this particular location within 48 hours prior to the anesthetic as documented. Ulyess BlossomIANA Ivah Girardot, MD 12:09 PM

## 2018-04-22 NOTE — H&P (Signed)
Urologic Surgery   History and Physical Note    Patient: [name]    LOS: 0 days         HPI         HPI:  [name] is a 32 y.o. male with a history of cervical disc herniation and nephrolithiasis who has been recently evaluated at both [facility] ED and [facility] ED for left flank pain and was found to have a 6 mm obstructing stone at the left UVJ with subsequent mild-moderate left sided hydronephrosis.  In the ED, yesterday 7/1 patient was hemodynamically stable and afebrile,  his renal function was stable with a creatinine of 0.80 GFR of 120, he was without leukocytosis and his UA was with scant pyuria, 0-5 WBC's.  He was discharged home with medical management including Flomax and P.O toradol for trial of passage.    He followed up with the urologic clinic this am as he continues to have persistent left renal colic reports pain is constant 3/10 with periods of worsening intensity, at worst 7-10/10, waves of nausea associated with worsening pain.  Also reports frequency with small volume voids, slight, intermittent dysuria and gross hematuria.  Denies fever, chills, vomiting, constipation, diarrhea.      He has been taking P.O toradol without relief.  He does have tramadol prescribed to him for disc herniation that he has not taken.      Discussed options including continuing trial of passage using tramadol, toradol and flomax vs OR this afternoon for left ureteral stent vs U scope for definitive treatment. Patient would like to proceed with the OR and presents for treatment of his ureteral obstruction at this time.      No solids since last evening,  H2o -1/2 bottle throughout the course of this am, last sip at 11:15 am.      REVIEW OF SYSTEMS:  Systemic: Denies recent weight loss or gain or fatigue.  Eyes: Denies change in vision  ENT: Denies hearing problems, or loss. Denies swollen glands or stiff neck.  Chest: Denies recent cold, flu or upper respiratory illness. Denies cough or dyspnea.  Heart:  Denies recent heart trouble, chest pain or palpitations.  GI: Denies constipation, diarrhea, acid reflux or bloody stool.  GU: See HPI.  Musculoskeletal: Denies any muscle or joint pain stiffness or arthritis.  Skin: Denies rash, ulcers or skin problems.  Neuro: Denies numbness, tingling, dizziness or headaches.  Pysch: Denies depression, anxiety or psychosis.  Endocrine: Denies diabetes, hypothyroidism or hyperthyroidism.  Hematology: Denies recent bleeding, bruising or anemia.  Allergy/Immunological: Denies runny nose, allergic rhinitis, or itching eyes.     Past Medical Hx     Past Medical History:   Diagnosis Date    Herniated cervical disc     Kidney stone        Allergies      Allergies   Allergen Reactions    Peppers Anaphylaxis    Morphine Nausea And Vomiting    Percocet [Oxycodone-Acetaminophen] Nausea And Vomiting       Current Medications     No current facility-administered medications for this encounter.      Current Outpatient Prescriptions   Medication    ketorolac (TORADOL) 10 MG tablet    tamsulosin (FLOMAX) 0.4 MG capsule    ciprofloxacin (CIPRO) 500 MG tablet    phenazopyridine (PYRIDIUM) 100 MG tablet    oxybutynin (DITROPAN) 5 MG tablet    traMADol-acetaminophen (ULTRACET) 37.5-325 MG per tablet    cyclobenzaprine (  FLEXERIL) 10 MG tablet    clindamycin (CLEOCIN) 300 MG capsule    tamsulosin (FLOMAX) 0.4 MG capsule        Vitals   24 Hr Vitals Ranges:    BP: ()/()    Most recent vitals:    There were no vitals taken for this visit.      Physical Exam     24 Hr Vitals Ranges:    BP: ()/()      Most recent vitals:    There were no vitals taken for this visit.     General: alert, appears stated age and cooperative  Chest: unlabored  Heart: normal S1S2  Abdomen:soft, non tender, non distended, no suprapubic tenderness, + left flank pain  Back: + Left CVAT; no right CVAT  Extremities: WWP  Neuro: alert, grossly nonfocal    Intake/Output:   No intake/output data recorded.     Labs      BMP/Electrolytes CBC   Recent Labs      04/21/18   1136   Sodium  142   Potassium  4.8*   Chloride  104   CO2  29*   UN  9   Creatinine  0.80   Glucose  102*   Calcium  9.7   Albumin  4.6    Recent Labs      04/21/18   1136   WBC  8.6   Hemoglobin  15.2   Hematocrit  44   Platelets  264   MCV  85   RDW  13.0   Seg Neut %  65.8   Lymphocyte %  27.6   Monocyte %  4.9   Eosinophil %  1.2   Basophil %  0.3      Liver Function Cardiac Studies   Recent Labs      04/21/18   1136   AST  20   ALT  33   Bilirubin,Total  0.3     No components found with this basename: ALKPHOS No results for input(s): CKTS, TROPU, TROP, MCKMB, CKMB in the last 168 hours.    No components found with this basename: RICKMBS    Coagulation Glucose   No results for input(s): PTI, INR in the last 8760 hours.    No results for input(s): PTT in the last 8760 hours.       Lab results: 04/21/18  1136 06/12/17  0435   Glucose 102* 94       No results found for: HA1C     URINE:      Lab results: 04/21/18  1118  06/07/17  1344   Appearance,UR CLEAR  < >  --    Color, UA YELLOW  < >  --    Glucose,UA NEG  --   --    Ketones, UA NEG  < >  --    Specific Gravity,UA 1.022  < >  --    Blood,UA 3+*  < >  --    pH,UA 7.0  --   --    Protein,UA NEG  < >  --    Nitrite,UA NEG  < >  --    Leuk Esterase,UA TRACE*  < >  --    RBC,UA >50*  < >  --    WBC,UA 0 - 5  < >  --    Mucus,UA Present  --   --    Glucose,UA POCT  --   --  Normal   Ketones,UA POCT  --   --  ++  moderate   Specific gravity,UA POCT  --   --  1.020   Blood,UA POCT  --   --  About 250*   PH,UA POCT  --   --  5.0   Protein,UA POCT  --   --  Negative   Nitrite,UA POCT  --   --  Negative   Leuk Esterase,UA POCT  --   --  Negative   < > = values in this interval not displayed.        CULTURES:   Aerobic Culture   Date Value Ref Range Status   06/12/2017 MICRO REPORT FOLLOWS (Culture, Urine)  Final     Comment:     Specimen: .Clean catch urine  Collected: 06/12/2017 06:25    Status: Final      Last  Updated: 06/13/2017 08:18      Culture Result (Final)  No Growth         06/07/2017 .  Final             Imaging/Diagnostics     Ct Abdomen And Pelvis Without Contrast    Result Date: 04/21/2018  6 mm calculus at the left UVJ, almost in the bladder with mild to minimal left-sided hydroureteronephrosis. Tiny right-sided nonobstructing calculi. END OF IMPRESSION UR   Portable US Renal Retroperitoneal Complete    Result Date: 04/21/2018  Nonspecific mild fullness versus a mild hydronephrosis involving the bilateral kidneys. No definite renal calculi. END OF IMPRESSION UR Imaging submits this DICOM format image data and final report to the [facility], an independent secure electronic health information exchange, on a reciprocally searchable basis (with patient authorization) for a minimum of 12 months after exam date.       Assessment   [name] is a 32 y.o. male with a history of cervical disc herniation and nephrolithiasis who has been recently evaluated at both [facility] ED and [facility] ED for left flank pain and was found to have a 6 mm obstructing stone at the left UVJ with subsequent mild-moderate left sided hydronephrosis with persistent left renal colic, nausea and LUTS despite medical management.    Patient is in acute/severe pain at time of interview.    Procedure was reviewed with patient and his mother.  He was reminded that the plan will be for cystosopy, left ureteroscopy w/without laser and left ureteral stent however if OR availability does not allow d/t emergent case of there are signs of infected efflux from left ureter once inside bladder we will not be able to perform ureteroscopy and therefore case would be cystoscopy with ureteral stent and he will have to return at a later date for definitive treatment.        Plan      We recommend the following:   OR for Cystoscopy with possible left ureteroscopy w/without laser lithotripsy, left ureteral stent placement and left retrograde pyelogram   2 g  ancef for surgical prophylaxis   Anticipate same day discharge   Follow up in outpatient Urology clinic with Dr. Schuyler Amor for stent removal       D/W Dr. Schuyler Amor    Any questions please page the Urology Consult Pager   Thank You for the Consult,  Tyrell Antonio, NP       Author: Tyrell Antonio, NP  as of: 04/22/2018  at: 2:21 PM

## 2018-04-23 ENCOUNTER — Encounter: Payer: Self-pay | Admitting: Urology

## 2018-04-23 NOTE — Telephone Encounter (Signed)
Pt s/p left u scope with JW 7/2.  Will need follow up OV for cysto stent removal with JW 1-2 weeks or soonest available.

## 2018-04-23 NOTE — Telephone Encounter (Signed)
[place] patient has confirmed.

## 2018-04-27 LAB — STONE ANALYSIS
Calculi Mass: 24 mg
Calculi Number: 1
Stone Weight: 1 mm

## 2018-04-29 NOTE — Op Note (Signed)
Operative Note (Surgical Case/Log ID: [id number])       Date of Surgery: 04/22/2018       Surgeons: Surgeon(s) and Role:     * Ancil BoozerWachterman, Angellica Maddison Behn, MD - Primary       Pre-op Diagnosis: Pre-Op Diagnosis Codes:     * Kidney stone on left side [N20.0]       Post-op Diagnosis: Post-Op Diagnosis Codes:     * Kidney stone on left side [N20.0]       Procedure(s) Performed: Procedure(s) (LRB):  CYSTOSCOPY URETERAL STENT (Left)  URETEROSCOPY WITH Stone removal       Anesthesia Type: General        Fluid Totals: No intake/output data recorded.       Estimated Blood Loss: 0 mL       Specimens to Pathology:  ID Type Source Tests Collected by Time Destination   A : left ureteral stone for stone analysis STONE [facility] Ancil BoozerWachterman, Vince Ainsley Behn, MD 04/22/2018 1710           Temporary Implants: Stent Inlay Optima [id number] 26cm [id number]; Removal Plan:  Or As Directed By Md; (Anticipated Removal Date:05/06/2018);            Packing:                 Patient Condition: good       Indications: Intractable left sided pain, distal stone       Findings (Including unexpected complications): Distal stone removed  4.7x26 stent, no string     Description of Procedure:   SURGEON: Ancil BoozerJared Behn Brandi Tomlinson, MD    PREOPERATIVE DIAGNOSIS: Left kidney stone.    POSTOPERATIVE DIAGNOSIS: Left kidney stone.    OPERATIVE PROCEDURE:   Cystoscopy, Left ureteroscopy, stone removal, retrograde pyelogram, ureteral stent.      ANESTHESIA: General.    DESCRIPTION OF PROCEDURE: The patient was brought to the operating room, general anesthesia administered. He was placed in dorsal lithotomy, prepped and draped in normal sterile fashion. Safety check-list was performed.      A 21 French cystoscope was inserted in the bladder and the bladder surveyed, showing no masses, tumor, lesions.     A 0.35 Sensor wire was inserted into the ureteral orifice and passed into the renal pelvis under fluoroscopic guidance and secured as a safety wire.    A  semirigid ureteroscope was passed adjacent to the wire with the assistance of a 0.038 Bentson wire.  The stone was found in the distal ureter, somewhat behind a flap of tissue or edema.  This was able to be basketed and removed in one piece.      The mid and distal ureter was observed showing no stone or injury.  A retrograde pyelogram was performed and a 4.7 x 26 stent was placed over the wire with good curl seen in the renal pelvis and the bladder. The bladder was drained at the end of the case.    COMPLICATIONS: none    CONDITION: good    DISPOSITION: To PACU.    FOREIGN MATERIALS: Left ureteral stent 4.7 x 26, no string.    SPECIMEN: Left  kidney stone    PLAN: The patient will return to the office for stent removal.    Signed:  Ancil BoozerJared Behn Kalee Broxton, MD  on 04/29/2018 at 1:13 PM

## 2018-05-01 ENCOUNTER — Encounter: Payer: Self-pay | Admitting: Urology

## 2018-05-01 ENCOUNTER — Ambulatory Visit: Payer: Self-pay | Attending: Urology | Admitting: Urology

## 2018-05-01 VITALS — BP 122/77 | HR 91 | Temp 98.3°F | Ht 71.0 in | Wt 236.0 lb

## 2018-05-01 DIAGNOSIS — N200 Calculus of kidney: Secondary | ICD-10-CM

## 2018-05-01 MED ORDER — CIPROFLOXACIN HCL 500 MG PO TABS *I*
500.0000 mg | ORAL_TABLET | Freq: Once | ORAL | Status: AC
Start: 2018-05-01 — End: 2018-05-01
  Administered 2018-05-01: 500 mg via ORAL

## 2018-05-01 NOTE — Progress Notes (Signed)
Chief complaint: left kidney stone    History of present illness:  Thank you for referring [name].  He is a pleasant 32 y.o. male with history of kidney stones.    S/p left ureteroscopy, stone removal (7/19)  Second episode of stone.    Last stone was 05/2017- [facility]- Dr. Reynolds Bowl.     ROS:  Const: Denies fevers, chills  Heart: Denies chest pain  Resp: Denies wheezing, cough, shortness of breath     Medications:   Current Outpatient Prescriptions   Medication    tamsulosin (FLOMAX) 0.4 MG capsule    phenazopyridine (PYRIDIUM) 100 MG tablet    oxybutynin (DITROPAN) 5 MG tablet    traMADol-acetaminophen (ULTRACET) 37.5-325 MG per tablet    cyclobenzaprine (FLEXERIL) 10 MG tablet    tamsulosin (FLOMAX) 0.4 MG capsule     No current facility-administered medications for this visit.      Allergies:   Allergies   Allergen Reactions    Peppers Anaphylaxis    Morphine Nausea And Vomiting    Percocet [Oxycodone-Acetaminophen] Nausea And Vomiting     PMH/PSH/SH/FH:  Past Medical History:   Diagnosis Date    Anxiety     GERD (gastroesophageal reflux disease)     Herniated cervical disc     Kidney stone     Past Surgical History:   Procedure Laterality Date    APPENDECTOMY      Left knee arthroscopy      PR CYSTO/URETERO/PYELOSCOPY, DX Left 04/22/2018    Procedure: URETEROSCOPY WITH HOLMIUM LASER;  Surgeon: Ancil Boozer, MD;  Location: [facility] MAIN OR;  Service: Urology    PR CYSTOSCOPY,INSERT URETHRAL STENT Left 04/22/2018    Procedure: CYSTOSCOPY URETERAL STENT;  Surgeon: Ancil Boozer, MD;  Location: [facility] MAIN OR;  Service: Urology      Family History   Problem Relation Age of Onset    GERD Father     Social History   Substance Use Topics    Smoking status: Current Every Day Smoker     Packs/day: 0.50    Smokeless tobacco: Never Used      Comment: vape    Alcohol use Yes        Exam  Blood pressure 122/77, pulse 91, temperature 36.8 C (98.3 F), height 1.803 m (5\' 11" ), weight 107 kg (236  lb).  Physical Exam  GENERAL:  No acute distress, well developed  HEART: Regular rate  LUNG: Normal respiratory effort, no audible wheeze    ===============================================================  REASON FOR CYSTOSCOPY: Stent removal     The risks, benefits, complications, treatment options, and expected outcomes were discussed with the patient. The patient concurred with the proposed plan, giving informed consent.    NARRIATIVE  The patient was placed in the supine position, prepped and draped in the usual sterile fashion.  Cystoscopy was carried out with a 16 [place] flexible cytoscope with video guidance.    FINDINGS:   Indwelling stent removed.    ANTIBIOTIC: A single dose (500 mg) Ciprofloxacin was given to patient to prevent post instrumentation infection.  ================================================================      Labs  Urine analysis shows :     Assessment:  32 y.o. male with history of kidney stones.  Discussed general stone prevention.  Plan for imaging in 6 months, [facility].    Plan:  Return for 24 hour urine, f/u after, then f/u 6 months with KUB and RUS prior.      Thank you for allowing me to  participate in your patient's care. I will keep you informed regarding his future progress.    Sincerely,    Darlin CocoVictoria Lynn Mesko, FNP  9:55 AM    [facility] and agree with the above.   Beau FannyJared Angeliyah Kirkey, MD

## 2018-05-01 NOTE — Patient Instructions (Addendum)
Quitting Smoking for Older Adults    About this topic   You may have started smoking when you were much younger. As an adult, you may smoke regularly. It is possible to become addicted to smoking. The longer you have been smoking, the harder it is to give up.  Most people have heard of the dangers of smoking. It is helpful to your health if you stop smoking. It also helps the health of others.  General   Older smokers are at high risk from smoking. The longer you have smoked, the more toxins you have been exposed to. Toxins are the bad chemicals in the tobacco. As someone who has smoked for a long time, you are more likely to have illnesses related to smoking.  It is never too late to quit smoking. It helps your health even at an older age. You will begin to notice changes soon after you stop smoking. Here are some steps you can take to help you quit smoking:  · Set a date to quit smoking.  · Pay attention to when you smoke now and why you are smoking. Some people find it helpful to write down each time you smoke. Include the hour and what you are doing. This will let you plan ahead about what you will do instead of smoking during those times.  · Slowly reduce your smoking until your quit date.  · Remove cigar and other tobacco products from your home, car, and workplace.  · Avoid places and situations where you are more likely to smoke. If people close to you smoke, ask them to quit with you.  · Reward or treat yourself every time you do not smoke. Do not use food as a reward. Perhaps place the cost of the cigarette you did not smoke into a collection jar and save the change towards a special occasion or reward.  · Ask your doctor for help.  · Call [place] Smokers' Quitline for free help at 1-866-697-8487 or www.nysmokefree.com.     What will the results be?   When you start to quit:  · Blood flow improves right away.  · Your lungs become more active.  · Heart rate and blood pressure lower.  · You have  less chance of having a heart attack.  · You will help others be healthy since they are not around secondhand smoke.  After a few days to weeks:  · Food tastes and smells better.  · Your lungs begin to work better.  · Breathing becomes easier.  After a few weeks to months:  · You have more energy.  · Lungs become clear and work better.  · You are less likely to get colds and other lung infections.  · Shortness of breath decreases.  · Clogging of sinuses decreases.  When you have fully stopped smoking, after a while you will:  · Lower the risk of lung cancer  · Lower the risk of cancer of the mouth, esophagus, voice box, bladder, pancreas, cervix, and kidney  · Lower the risk of heart illnesses like stroke and heart attack  · Preserve your eyesight and the look of your teeth and aging skin  · Lessen the risk for having type 2 diabetes  · Reverse bone loss  · Lessen the risk of postop problems  · Lessen the risk of peptic ulcer and improve healing if ulcer is already there   What lifestyle changes are needed?   · Thoroughly wash and   remove all ashtrays from your home and office.  · Exercise regularly. This may help to lower stress. Going for a walk is good exercise.  · Watch your eating habits and avoid gaining weight. Eat healthy foods and snacks to keep a healthy weight  · Change your daily routine. Try to change things. Eat breakfast at a different place or drink tea instead of coffee.  · Try to relax. Listen to music, meditate, do yoga or breathing exercises, take a relaxing bath or hot shower.  · Control your thoughts and emotions. Write or record a journal, create new hobbies, and think positive.  · Connect with family and friends. Talk to a friend, get a pet, share your problems with family members, and participate in your community.  What drugs may be needed?   The doctor may order drugs to:  · Help with withdrawal signs  · Reduce your urges to smoke  Gums, lozenges, and skin patches may be given as a  substitute for tobacco.  Will there be any other care needed?   It helps to have other people to support you when you are trying to quit smoking. Talk to your family and friends about how they can best help you. You may also want to think about support groups or counseling.  What problems could happen?   You may have withdrawal signs like:  · Trouble sleeping  · Being irritable  · Being anxious or restless  · Getting frustrated or angry  · Trouble thinking clearly  · Low mood  When do I need to call the doctor?   · Feel very nervous  · Low mood  · Have behavioral changes  · Think about harming or killing yourself  · Have unexpected side effects from any prescribed drugs  Helpful tips   · Commit yourself to stop smoking.  · Focus on your goals and work to achieve them.  Where can I learn more?   Agency for Healthcare Research and Quality  http://www.ahrq.gov/patients-consumers/prevention/lifestyle/tobacco/helpsmokers.html   American Cancer Society  http://www.cancer.org/acs/groups/cid/documents/webcontent/002971-pdf.pdf   American Lung Association  http://www.lung.org/stop-smoking/about-smoking/facts-figures/smoking-and-older-adults.html   Centers for Disease Control and Prevention  http://www.cdc.gov/tobacco/campaign/tips/   http://www2c.cdc.gov/podcasts/media/pdf/HealthyAgingSmoking.pdf   Smoke Free  http://www.smokefree.gov/   Last Reviewed Date   2012-11-18  Consumer Information Use and Disclaimer   This information is not specific medical advice and does not replace information you receive from your health care provider. This is only a brief summary of general information. It does NOT include all information about conditions, illnesses, injuries, tests, procedures, treatments, therapies, discharge instructions or life-style choices that may apply to you. You must talk with your health care provider for complete information about your health and treatment options. This information should not be used to decide  whether or not to accept your health care provider’s advice, instructions or recommendations. Only your health care provider has the knowledge and training to provide advice that is right for you.  Copyright   Copyright © 2015 [employer] and its affiliates and/or licensors. All rights reserved.     Cystoscopy Frequently Asked Questions    Indication:  To evaluate blood in the urine, to assess possible causes of incontinence.    Do we use a numbing agent?   We may use 2% lidocaine jelly, depending on your diagnosis.  It is a gel that is placed into the urethra.  No needle is used.  The numbing effect will last 1 to 2 hours.  Pt will still   to feel the urge to urinate.    Should I eat or drink the day of the procedure?  Yes!  Please eat and drink as normal the day of the procedure.  Being well hydrated will help the doctor better visualize the ureter and kidney function.    Are there any medications I should stop taking before the procedure?  No.  You may take all your medications.    How long will the procedure take?  You should plan to be in the office for at least an hour to allow time for paperwork and prep.  From the time the doctor enters the room, the procedure itself takes between 5 to 15 minutes to complete.    What will happen during my procedure?  You will be asked to provide a urine specimen prior to the procedure to ensure there is no current infection.  If you are found to have an infection, the procedure may be rescheduled at the Doctors discretion.  You will be brought back to a procedure room, vital signs will be take, and you will be asked to sign a consent form.  You will then be asked to undress from the waist down, and you will lie down on the procedure table.  Your genitals will be cleansed with an antibacterial soap, and the lidocaine gel will be instilled into the urethra.  The doctor will insert the cystoscope into the bladder via the urethra.  He will  be able to visualize the urethra, the bladder and the ureters.  A sample of the fluid taken from the bladder will be sent to pathology to look for any abnormal cells.  The result takes 7-10 days to come back.  We will either call you with the result, or your doctor may have you return to the office to discuss the results of all tests.    What kind of scope is used?  We use a flexible cystoscope in our office.    Will it hurt?  Most people feel a large amount of pressure, a strong urge to urinate and a little burning along the urethra, although the experience is is different for everyone.      Will I have antibiotics afterward?  This will be at the discretion of your doctor.    How long before I may resume sexual activity?  The same day.      Will I have any after effects?  You may experience burning with urination and a small amount of blood in the urine for a few days afterwards.  This is normal.  Drink plenty of fluids for a few days to keep the urinary tract flushed out.    Contact your Doctor if you develop the following:   Excessive pain   Prolonged excessive bleeding or temperature greater then 101 degrees fahrenheit     Will I need to be out of work or restrict my activities for any length of time afterward?  You may return to all normal activities the same day.    A renal ultrasound [facility].    Time:   3:00 pm    Date:    10/31/18    Nothing to eat after midnight (or 3 hours).   Drink 16 oz of water 1 hour prior to ultrasound.

## 2018-05-01 NOTE — Procedures (Signed)
PRE-PROCEDURE TIME OUT    What procedure is being performed? Cysto/stent removal  Correct procedure? yes  Correct Patient (use 2 identifiers)? yes  Correct Site? yes  Site Marked? N/A  Correct Side? N/A  Correct patient position? yes  Consent verified? yes  Tests results available?N/A  Imaging results available? N/A  List participants involved in time out. Dr. Jared Wachterman , Torri Michalski A/LPN  Availability of correct instruments and any special equipment or requirements? yes

## 2018-06-30 ENCOUNTER — Telehealth: Payer: Self-pay | Admitting: Urology

## 2018-06-30 NOTE — Telephone Encounter (Signed)
The patient is calling in regards to note below he would like to know if he can bet a replacement sent.    He states that it arrived broken out of the box he would like to have it replaced without his insurance being billed again or him having to pay for a new kit out of pocket.    Please advise.    He can be reached at [phone]

## 2018-06-30 NOTE — Telephone Encounter (Signed)
Patient received his Litholink kit and the box was crushed causing the collection jug to break. He reached out to [facility] and they stated that they are only the manufacturers and could not provide him with another kit. He would like to speak with someone to see if there is any way to get a new one.    Please call him at [phone].

## 2018-07-01 NOTE — Telephone Encounter (Signed)
Writer called [employer] and they are sending a new package out, patient will be receiving it in 3 to 4 days. Writer has also spoke with patient regarding the update.

## 2018-07-03 ENCOUNTER — Ambulatory Visit: Payer: Self-pay | Admitting: Urology

## 2018-07-20 ENCOUNTER — Encounter: Payer: Self-pay | Admitting: Gastroenterology

## 2018-07-26 ENCOUNTER — Encounter: Payer: Self-pay | Admitting: Gastroenterology

## 2018-08-19 ENCOUNTER — Ambulatory Visit: Payer: PRIVATE HEALTH INSURANCE | Attending: Urology | Admitting: Urology

## 2018-08-19 ENCOUNTER — Encounter: Payer: Self-pay | Admitting: Urology

## 2018-08-19 VITALS — BP 131/68 | HR 98 | Ht 71.0 in | Wt 270.0 lb

## 2018-08-19 DIAGNOSIS — N200 Calculus of kidney: Secondary | ICD-10-CM

## 2018-08-19 LAB — POCT URINALYSIS DIPSTICK
Glucose,UA POCT: NORMAL mg/dL
Ketones,UA POCT: NEGATIVE mg/dL
Leuk Esterase,UA POCT: NEGATIVE
Lot #: 36253602
Nitrite,UA POCT: NEGATIVE
PH,UA POCT: 5 (ref 5–8)
Specific gravity,UA POCT: 1.015 (ref 1.002–1.030)

## 2018-08-19 NOTE — Patient Instructions (Addendum)
For your information, would not make big changes right now      Avoid these High-oxalate Foods and Drinks  High-oxalate foods have more than 10 mg of oxalate per serving       Drinks   Dark or "robust" beer   Black tea   Chocolate milk   Cocoa   Instant coffee   Hot chocolate   Juice made from high oxalate fruits (see below for high oxalate fruits)   Ovaltine   Soy drinks Dairy   Chocolate milk   Soy cheese   Soy milk   Soy yogurt           Fats, Nuts, Seeds   Nuts   Nut butters   Sesame seeds   Tahini   Soy nuts Meat   None       Starch    Amaranth   Buckwheat   Cereal (bran or high fiber)   Crisp bread (rye or wheat)   Fruit cake   Grits   Pretzels   Taro   Wheat bran   Wheat germ   Whole wheat bread   Whole wheat flour           Fruit   Blackberries   Blueberries   Carambola   [place] grapes   Currents   Dewberries   Elderberries   Figs   Fruit cocktail   Gooseberry   Kiwis   Lemon peel   Lime peel   Orange peel   Raspberries   Rhubarb   Canned strawberries   [place]   Tangerines   Vegetables   Beans (baked, green, dried, kidney)   Beets   Beet greens   Beet root   Carrots   Celery   Chicory   Collards   Dandelion greens   Eggplant   Escarole   Kale     Vegetables continued   Leeks   Okra   Olives   Parsley   Peppers (chili and green)   Pokeweed   Potatoes (baked, boiled, fried)   [place]   Spinach   Summer squash   Sweet potato   Swiss chard   Zucchini     Condiments   Black pepper (more than 1 tsp.)   Marmalade   Soy sauce Miscellaneous   Chocolate   Parsley       Limit these Moderate-oxalate Foods and Drinks  You should have no more than two or three servings of these foods per day. Moderate-oxalate foods have 2 to10 mg of oxalate per serving  Drinks  Draft beer   Carrot juice   Brewed coffee   Cranberry juice   Grape juice   Guinness draft beer   Matetea tea   Orange juice   Rosehip tea   Tomato  juice   Twining's black currant tea   Dairy  Yogurt   Fats, nuts, seeds  Flaxseed   Sunflower seeds   Fruit  Apples   Applesauce   Apricots   Coconut   Cranberries   Mandarin orange   Orange   Fresh peaches   Fresh pear   Pineapples   Purple and Damson plums   Prunes   Fresh strawberries   Meat  Liver   Sardines   Starch  Bagels   Brown rice   Cornmeal   Corn starch   Corn tortilla   Fig cookie   Oatmeal   Ravioli (no sauce)   Spaghetti in red sauce   Sponge cake  Cinnamon Pop tart   White bread   Vegetables  Artichoke   Asparagus   Broccoli   Brussel sprouts   Carrots (canned)   Corn   Fennel   Lettuce   lima beans   Mustard greens   Onions   Parsnip   Canned peas   Tomato   Tomato soup   Turnips   Vegetable soup   Watercress   Miscellaneous  Ginger   Malt   Potato chips (less than 3.5 oz.)   Strawberry jam/preserves   Thyme     Enjoy these Low-oxalate Foods and Drinks  Eat as much of these low-oxalate foods as you like. Low-oxalate foods have less than 2 mg of oxalate per serving.   Drinks  Apple cider   Apple juice   Apricot nectar   Bottled beer   Buttermilk   Cherry juice   Cola   Grapefruit juice   Green tea   Herbal teas (see below)   Lemonade   Lemon juice   Limeade   Lime juice   Milk   Oolong tea   Pineapple juice   Wine   Herbal Teas  Celestial Seasonings (Sleepytime, Peppermint, Wild [address], Mandarin Orange Spice, Museum/gallery conservator, [employer] Spice)   [place]. Mitzie Na (Cranberry Apple, [employer], I Love [place], [place] and Mulberry, Mint [place], Sweet Dreams)   Ihor Austin. Lipton (Gentle Orange, Lemon Soothe, Chamomile flowers, Brewing technologist)   Dairy  Big Lots   Fats, nuts, seeds  Butter   Margarine   Mayonnaise   Salad dressing   Vegetable oil   Fruit  Avocados   Bananas   Cherries (bing and sour)   Grapefruit   Grapes (green and  red)   Huckleberries   [place]   Litchi/Lychee   Mangoes   Melons   Nectarines   Papaya   Passion fruit   Canned peaches   Canned pears   Green and yellow plums   Raisins (1/4 cup)   Meat  Bacon   Beef   Corned beef   Fish (except sardines)   Ham   Lamb   SYSCO   Pork   Poultry   Shellfish   Starches  Barley   Cereals (corn or rice)   Cheerios   Chicken noodle soup   Egg noodles   English muffin   Graham crackers   Macaroni   Pasta (plain)   White rice   Wild rice   Vegetables  Cabbage   Cauliflower   Chives   Cucumber   Endive   Kohlrabi   Mushrooms   Peas   Radishes   Water chestnut   Condiments  Basil   Cinnamon   Corn syrup   Dijon mustard   Dill   Honey   Imitation vanilla extract   Jelly made from low oxalate fruits   Ketchup (1 Tbsp.)   Maple syrup   Nutmeg   Oregano   Peppermint   Sage   Sugar   Vinegar   White pepper   Miscellaneous   Gelatin (unflavored)   Hard candy   Jell-O   Lemon balm   Lemon juice   Lime juice       A renal ultrasound and KUB has been scheduled for you at [facility].    Time:   3:00 pm    Date:    10/31/18    Nothing to eat after midnight (or 3 hours).  Drink 16 oz of water 1 hour prior to ultrasound.

## 2018-08-19 NOTE — Progress Notes (Signed)
Chief complaint: left kidney stone    History of present illness:  Thank you for referring [name].  He is a pleasant 32 y.o. male with history of kidney stones.  S/p left ureteroscopy (7/19).    2 episode of stone. (R 2018, L 2019)    Last stone was 05/2017- [facility]- Dr. Reynolds Bowl.     No IBD, gastric surgery    Prior to stones: 2-3L Soda, fast food, low urine    LithoLink  10/19  Vol 2.6  U Ox (<40)  PH 6.628 (<6.2)  U Ca 220 (<250)  U Uric 1.35 (<0.8)    Stone  70% calcium oxalate monohydrate, and   30% calcium phosphate (hydroxy- and carbonate- apatite).     ROS:  Const: Denies fevers, chills  Heart: Denies chest pain  Resp: Denies wheezing, cough, shortness of breath     Medications:   Current Outpatient Medications   Medication    traMADol-acetaminophen (ULTRACET) 37.5-325 MG per tablet    cyclobenzaprine (FLEXERIL) 10 MG tablet     No current facility-administered medications for this visit.      Allergies:   Allergies   Allergen Reactions    Peppers Anaphylaxis    Morphine Nausea And Vomiting    Percocet [Oxycodone-Acetaminophen] Nausea And Vomiting     PMH/PSH/SH/FH:  Past Medical History:   Diagnosis Date    Anxiety     GERD (gastroesophageal reflux disease)     Herniated cervical disc     Kidney stone     Past Surgical History:   Procedure Laterality Date    APPENDECTOMY      Left knee arthroscopy      PR CYSTO/URETERO/PYELOSCOPY, DX Left 04/22/2018    Procedure: URETEROSCOPY WITH HOLMIUM LASER;  Surgeon: Ancil Boozer, MD;  Location: [facility];  Service: Urology    PR CYSTOSCOPY,INSERT URETHRAL STENT Left 04/22/2018    Procedure: CYSTOSCOPY URETERAL STENT;  Surgeon: Ancil Boozer, MD;  Location: [facility];  Service: Urology      Family History   Problem Relation Age of Onset    GERD Father     Social History     Tobacco Use    Smoking status: Current Every Day Smoker     Packs/day: 0.50    Smokeless tobacco: Never Used    Tobacco comment: vape   Substance Use Topics    Alcohol  use: Yes    Drug use: No        Exam  Blood pressure 131/68, pulse 98, height 1.803 m (5\' 11" ), weight 122.5 kg (270 lb).  Physical Exam  GENERAL:  No acute distress, well developed  HEART: Regular rate  LUNG: Normal respiratory effort, no audible wheeze    Labs  Urine analysis shows :     Assessment:  32 y.o. male with history of kidney stones.  Discussed that his LithoLink dose have abnormalities.  However, the dietary advice (less meat, less green leafy vegetables) runs count to the good health habits he has been working on. Discussed it is reasonable to follow how quick stones return prior to making dietary changes.    Plan:  Return for KUB, [place].    Thank you for allowing me to participate in your patient's care. I will keep you informed regarding his future progress.    Sincerely,    Ancil Boozer, MD  3:42 PM

## 2018-10-08 ENCOUNTER — Encounter: Payer: Self-pay | Admitting: Urology

## 2018-10-29 ENCOUNTER — Telehealth: Payer: Self-pay | Admitting: Urology

## 2018-10-29 NOTE — Telephone Encounter (Signed)
Copied from CRM [id number]. Topic: Access to Care - Labs/Orders/Imaging  >> Oct 29, 2018 10:05 AM Princess Perna A wrote:  [name] is calling to request patient renal US order(s) be fax to [facility] at [phone]. Please call her  back to confirm at [phone]- [phone].

## 2018-10-29 NOTE — Telephone Encounter (Signed)
Faxed to [facility]

## 2018-11-04 ENCOUNTER — Telehealth: Payer: Self-pay | Admitting: Urology

## 2018-11-04 NOTE — Telephone Encounter (Unsigned)
Copied from CRM [id number]. Topic: Appointments - Schedule Appointment  >> Nov 04, 2018  3:54 PM Brayton Caves wrote:  [name] is calling to schedule an Radiology appointment before his initial visit with [facility] NP, January 17.2020 . Please call the patient to schedule at [phone].

## 2018-11-04 NOTE — Telephone Encounter (Signed)
Patient missed imaging appt at [facility], Called and re-scheduled for this Thursday 1/16 at 10:15 A    Called patient and made aware of new imaging appt.

## 2018-11-06 ENCOUNTER — Other Ambulatory Visit: Payer: Self-pay | Admitting: Gastroenterology

## 2018-11-06 ENCOUNTER — Ambulatory Visit: Payer: Self-pay | Admitting: Urology

## 2018-11-07 ENCOUNTER — Ambulatory Visit: Payer: Self-pay | Attending: Urology | Admitting: Urology

## 2018-11-07 ENCOUNTER — Telehealth: Payer: Self-pay | Admitting: Urology

## 2018-11-07 ENCOUNTER — Encounter: Payer: Self-pay | Admitting: Urology

## 2018-11-07 VITALS — BP 130/80 | HR 92 | Temp 98.7°F | Ht 71.0 in | Wt 270.0 lb

## 2018-11-07 DIAGNOSIS — R319 Hematuria, unspecified: Secondary | ICD-10-CM

## 2018-11-07 DIAGNOSIS — N200 Calculus of kidney: Secondary | ICD-10-CM

## 2018-11-07 LAB — POCT URINALYSIS DIPSTICK
Glucose,UA POCT: NORMAL mg/dL
Ketones,UA POCT: NEGATIVE mg/dL
Lot #: 36253602
Nitrite,UA POCT: NEGATIVE
PH,UA POCT: 8 (ref 5–8)
Protein,UA POCT: NEGATIVE mg/dL
Specific gravity,UA POCT: 1.01 (ref 1.002–1.030)

## 2018-11-07 NOTE — Telephone Encounter (Signed)
patient is self pay for visit with [facility] 11/07/18 due to insurance change at work

## 2018-11-07 NOTE — Patient Instructions (Addendum)
Quitting Smoking for Older Adults    About this topic   You may have started smoking when you were much younger. As an adult, you may smoke regularly. It is possible to become addicted to smoking. The longer you have been smoking, the harder it is to give up.  Most people have heard of the dangers of smoking. It is helpful to your health if you stop smoking. It also helps the health of others.  General   Older smokers are at high risk from smoking. The longer you have smoked, the more toxins you have been exposed to. Toxins are the bad chemicals in the tobacco. As someone who has smoked for a long time, you are more likely to have illnesses related to smoking.  It is never too late to quit smoking. It helps your health even at an older age. You will begin to notice changes soon after you stop smoking. Here are some steps you can take to help you quit smoking:  · Set a date to quit smoking.  · Pay attention to when you smoke now and why you are smoking. Some people find it helpful to write down each time you smoke. Include the hour and what you are doing. This will let you plan ahead about what you will do instead of smoking during those times.  · Slowly reduce your smoking until your quit date.  · Remove cigar and other tobacco products from your home, car, and workplace.  · Avoid places and situations where you are more likely to smoke. If people close to you smoke, ask them to quit with you.  · Reward or treat yourself every time you do not smoke. Do not use food as a reward. Perhaps place the cost of the cigarette you did not smoke into a collection jar and save the change towards a special occasion or reward.  · Ask your doctor for help.  · Call [place] Smokers' Quitline for free help at 1-866-697-8487 or www.nysmokefree.com.     What will the results be?   When you start to quit:  · Blood flow improves right away.  · Your lungs become more active.  · Heart rate and blood pressure lower.  · You have  less chance of having a heart attack.  · You will help others be healthy since they are not around secondhand smoke.  After a few days to weeks:  · Food tastes and smells better.  · Your lungs begin to work better.  · Breathing becomes easier.  After a few weeks to months:  · You have more energy.  · Lungs become clear and work better.  · You are less likely to get colds and other lung infections.  · Shortness of breath decreases.  · Clogging of sinuses decreases.  When you have fully stopped smoking, after a while you will:  · Lower the risk of lung cancer  · Lower the risk of cancer of the mouth, esophagus, voice box, bladder, pancreas, cervix, and kidney  · Lower the risk of heart illnesses like stroke and heart attack  · Preserve your eyesight and the look of your teeth and aging skin  · Lessen the risk for having type 2 diabetes  · Reverse bone loss  · Lessen the risk of postop problems  · Lessen the risk of peptic ulcer and improve healing if ulcer is already there   What lifestyle changes are needed?   · Thoroughly wash and   remove all ashtrays from your home and office.   Exercise regularly. This may help to lower stress. Going for a walk is good exercise.   Watch your eating habits and avoid gaining weight. Eat healthy foods and snacks to keep a healthy weight   Change your daily routine. Try to change things. Eat breakfast at a different place or drink tea instead of coffee.   Try to relax. Listen to music, meditate, do yoga or breathing exercises, take a relaxing bath or hot shower.   Control your thoughts and emotions. Write or record a journal, create new hobbies, and think positive.   Connect with family and friends. Talk to a friend, get a pet, share your problems with family members, and participate in your community.  What drugs may be needed?   The doctor may order drugs to:   Help with withdrawal signs   Reduce your urges to smoke  Gums, lozenges, and skin patches may be given as a  substitute for tobacco.  Will there be any other care needed?   It helps to have other people to support you when you are trying to quit smoking. Talk to your family and friends about how they can best help you. You may also want to think about support groups or counseling.  What problems could happen?   You may have withdrawal signs like:   Trouble sleeping   Being irritable   Being anxious or restless   Getting frustrated or angry   Trouble thinking clearly   Low mood  When do I need to call the doctor?    Feel very nervous   Low mood   Have behavioral changes   Think about harming or killing yourself   Have unexpected side effects from any prescribed drugs  Helpful tips    Commit yourself to stop smoking.   Focus on your goals and work to achieve them.  Where can I learn more?   Agency for Healthcare Research and Quality  SwimChampionship.fr.html   American Cancer Society  http://www.cancer.org/acs/groups/cid/documents/webcontent/002971-pdf.pdf   American Lung Association  https://wagner.biz/.html   Centers for Disease Control and Prevention  OpinionTrades.tn   MedicationWarning.com.br.pdf   Smoke Free  PoliceBars.uy   Last Reviewed Date   2012-11-18  Consumer Information Use and Disclaimer   This information is not specific medical advice and does not replace information you receive from your health care provider. This is only a brief summary of general information. It does NOT include all information about conditions, illnesses, injuries, tests, procedures, treatments, therapies, discharge instructions or life-style choices that may apply to you. You must talk with your health care provider for complete information about your health and treatment options. This information should not be used to decide  whether or not to accept your health care providers advice, instructions or recommendations. Only your health care provider has the knowledge and training to provide advice that is right for you.  Copyright   Copyright  2015 [facility] [employer]. and its affiliates and/or licensors. All rights reserved.    You are scheduled to have a Renal Ultrasound and KUB at [facility] on Monday May 04, 2019 at 7:45 AM.     One hour prior to the ultrasound drink 12 ounces of water.     If this ultrasound time is inconvenient you may re-schedule by calling [phone]

## 2018-11-07 NOTE — Progress Notes (Signed)
Chief complaint: left kidney stone    History of present illness:  Thank you for referring Lavina HammanKyle Grenda.  He is a pleasant 33 y.o. male with history of kidney stones.  S/p left ureteroscopy (7/19).  Here today for follow up, had imaging prior to check for stones.    2 episode of stone. (R 2018, L 2019)    Last stone was 05/2017- [facility]- Dr. Reynolds BowlLu.     No IBD, gastric surgery    Prior to stones: 2-3L Soda, fast food, low urine    LithoLink  10/19  Vol 2.6  U Ox (<40)  PH 6.628 (<6.2)  U Ca 220 (<250)  U Uric 1.35 (<0.8)    Stone  70% calcium oxalate monohydrate, and   30% calcium phosphate (hydroxy- and carbonate- apatite).     RUS 11/06/18:  the right kidney is without hydronephrosis or nephrolithiasis.  No focal lesion is identified in the right kidney.  The left kidney is without hydronephrosis or nephrolithiasis.  No focal lesions identified in the left kidney.  The echogenicity of both kidneys appears normal.    ROS:  Const: Denies fevers, chills  Heart: Denies chest pain  Resp: Denies wheezing, cough, shortness of breath     Medications:   Current Outpatient Medications   Medication    traMADol-acetaminophen (ULTRACET) 37.5-325 MG per tablet    cyclobenzaprine (FLEXERIL) 10 MG tablet     No current facility-administered medications for this visit.      Allergies:   Allergies   Allergen Reactions    Peppers Anaphylaxis    Morphine Nausea And Vomiting    Percocet [Oxycodone-Acetaminophen] Nausea And Vomiting     PMH/PSH/SH/FH:  Past Medical History:   Diagnosis Date    Anxiety     GERD (gastroesophageal reflux disease)     Herniated cervical disc     Kidney stone     Past Surgical History:   Procedure Laterality Date    APPENDECTOMY      Left knee arthroscopy      PR CYSTO/URETERO/PYELOSCOPY, DX Left 04/22/2018    Procedure: URETEROSCOPY WITH HOLMIUM LASER;  Surgeon: Ancil BoozerWachterman, Jared Behn, MD;  Location: [facility];  Service: Urology    PR CYSTOSCOPY,INSERT URETHRAL STENT Left 04/22/2018    Procedure: CYSTOSCOPY  URETERAL STENT;  Surgeon: Ancil BoozerWachterman, Jared Behn, MD;  Location: [facility];  Service: Urology      Family History   Problem Relation Age of Onset    GERD Father     Social History     Tobacco Use    Smoking status: Current Every Day Smoker     Packs/day: 0.50    Smokeless tobacco: Never Used    Tobacco comment: vape   Substance Use Topics    Alcohol use: Yes    Drug use: No        Exam  Blood pressure 130/80, temperature 37.1 C (98.7 F), height 1.803 m (5\' 11" ), weight 122.5 kg (270 lb).  Physical Exam  GENERAL:  No acute distress, well developed  HEART: Regular rate  LUNG: Normal respiratory effort, no audible wheeze    Labs  Urine analysis shows :   Recent Results (from the past 24 hour(s))   POCT urinalysis dipstick    Collection Time: 11/07/18  2:50 PM   Result Value Ref Range    Specific gravity,UA POCT 1.010 1.002 - 1.030    PH,UA POCT 8.0 5 - 8    Leuk Esterase,UA POCT Trace Marland Kitchen(!)  Negative    Nitrite,UA POCT Negative Negative    Protein,UA POCT Negative Negative mg/dL    Glucose,UA POCT Normal Normal mg/dL    Ketones,UA POCT Negative Negative mg/dL    Urobilinogen,UA      Bilirubin,Ur      Blood,UA POCT Trace (!) Negative    Exp date 02/19/2019     Lot # [id number]          Assessment:  33 y.o. male with history of kidney stones.  Discussed that his LithoLink dose have abnormalities.  However, the dietary advice (less meat, less green leafy vegetables) runs count to the good health habits he has been working on.   Reviewed imaging results with patient, no new stones formed. Continue hydration efforts.    Plan:  Return in about 6 months (around 05/08/2019) for Follow-up RUS, KUB prior.    Thank you for allowing me to participate in your patient's care. I will keep you informed regarding his future progress.    Sincerely,    Darlin Coco, FNP  2:46 PM

## 2019-01-06 ENCOUNTER — Ambulatory Visit
Admission: AD | Admit: 2019-01-06 | Discharge: 2019-01-06 | Disposition: A | Discharge status: Home / Self Care | Payer: Self-pay | Source: Ambulatory Visit | Attending: Emergency Medicine | Admitting: Emergency Medicine

## 2019-01-06 DIAGNOSIS — J069 Acute upper respiratory infection, unspecified: Secondary | ICD-10-CM | POA: Insufficient documentation

## 2019-01-06 DIAGNOSIS — F17210 Nicotine dependence, cigarettes, uncomplicated: Secondary | ICD-10-CM | POA: Insufficient documentation

## 2019-01-06 NOTE — ED Triage Notes (Signed)
Pt reports that he has a sore throat and chest congestion for past 3 days. Work sent him here to make sure he is okay to continue working.  Pt has taken dayquil for symptoms.        Triage Note   Farrell Ours, LPN

## 2019-01-06 NOTE — Discharge Instructions (Addendum)
Use flonase one spray each nostril 2 times a day for 2 weeks to help with inflammation and congestion.  Use over the counter mucinex medication to help with cough and congestion.  Follow up with PCP as needed for further evaluation if symptoms worsen or persist.

## 2019-01-06 NOTE — UC Provider Note (Addendum)
History     Chief Complaint   Patient presents with    Sore Throat     Patient is a 33 year old male presenting to urgent care with chief complaint of sore throat for three days. He developed some mild ear pain, nasal congestion and chest congestion today. His symptoms are worse in the morning. He has been taking Dayquil. He works in maintenance at [employer] and they asked him to be evaluated. He denies recent travel and known exposure to COVID19. He denies fever, chills, cough, and SOB.           Medical/Surgical/Family History     Past Medical History:   Diagnosis Date    Anxiety     GERD (gastroesophageal reflux disease)     Herniated cervical disc     Kidney stone         There is no problem list on file for this patient.           Past Surgical History:   Procedure Laterality Date    APPENDECTOMY      Left knee arthroscopy      PR CYSTO/URETERO/PYELOSCOPY, DX Left 04/22/2018    Procedure: URETEROSCOPY WITH HOLMIUM LASER;  Surgeon: Ancil Boozer, MD;  Location: [facility];  Service: Urology    PR CYSTOSCOPY,INSERT URETHRAL STENT Left 04/22/2018    Procedure: CYSTOSCOPY URETERAL STENT;  Surgeon: Ancil Boozer, MD;  Location: [facility];  Service: Urology     Family History   Problem Relation Age of Onset    GERD Father           Social History     Tobacco Use    Smoking status: Current Every Day Smoker     Packs/day: 1.00    Smokeless tobacco: Never Used    Tobacco comment: vape   Substance Use Topics    Alcohol use: Yes     Comment: occasional     Drug use: No     Living Situation     Questions Responses    Patient lives with Parent(s)    Homeless No    Caregiver for other family member     External Services     Employment     Domestic Violence Risk                 Review of Systems   Review of Systems   Constitutional: Negative for chills and fever.   HENT: Positive for congestion (mild), ear pain (mild) and sore throat. Negative for ear discharge and  rhinorrhea.    Eyes: Negative for pain, discharge and itching.   Respiratory: Negative for cough, shortness of breath and wheezing.         Positive for chest congestion       Physical Exam   Triage Vitals  Triage Start: Start, (01/06/19 1605)   First Recorded BP: 141/75, Resp: 18, Temp: 37 C (98.6 F), Temp src: TEMPORAL Oxygen Therapy SpO2: 97 %, Oximetry Source: Rt Hand, O2 Device: None (Room air), Heart Rate: 98, (01/06/19 1609)  .  First Pain Reported  0-10 Scale: 1, (01/06/19 1609)       Physical Exam  Vitals signs and nursing note reviewed.   Constitutional:       General: He is not in acute distress.     Appearance: He is well-developed. He is not ill-appearing or toxic-appearing.   HENT:      Head: Normocephalic and atraumatic.  Right Ear: Tympanic membrane, ear canal and external ear normal. Tympanic membrane is not erythematous or bulging.      Left Ear: Tympanic membrane, ear canal and external ear normal. Tympanic membrane is not erythematous or bulging.      Nose: Congestion (mild) present. No mucosal edema or rhinorrhea.      Mouth/Throat:      Mouth: Mucous membranes are moist.      Dentition: Abnormal dentition. Dental caries present. No gingival swelling.      Palate: Palate does not elevate in midline.      Pharynx: Uvula midline. Posterior oropharyngeal erythema (mild) present. No pharyngeal swelling, oropharyngeal exudate or uvula swelling.      Tonsils: No tonsillar exudate or tonsillar abscesses.   Eyes:      Conjunctiva/sclera: Conjunctivae normal.   Cardiovascular:      Rate and Rhythm: Normal rate and regular rhythm.      Heart sounds: Normal heart sounds.   Pulmonary:      Effort: Pulmonary effort is normal. No respiratory distress.      Breath sounds: No stridor. No wheezing, rhonchi or rales.   Lymphadenopathy:      Head:      Right side of head: No submental or submandibular adenopathy.      Left side of head: No submental or submandibular adenopathy.      Cervical:      Right  cervical: No superficial cervical adenopathy.     Left cervical: No superficial cervical adenopathy.   Neurological:      Mental Status: He is alert.          Medical Decision Making        Initial Evaluation:  ED First Provider Contact     Date/Time Event User Comments    01/06/19 [phone] ED First Provider Contact PITLER, TIMOTHY D Initial Face to Face Provider Contact          Patient was seen on: 01/06/2019        Assessment:  32 y.o.male comes to the [facility] with sore throat     Differential Diagnosis includes:    Viral URI  Strep pharyngitis   Seasonal allergies     Plan:    Use over the counter flonase one spray each nostril 2 times a day for 2 weeks to help with inflammation and congestion.  Use over the counter mucinex medication to help with cough and congestion.  Follow up with PCP as needed for further evaluation if symptoms worsen.     Note given to return to work tomorrow.     Questions were addressed prior to discharge, treatment plan is agreed upon at this time.         Final Diagnosis  Final diagnoses:   [J06.9] Viral URI (Primary)         Kizzie Ide, PA       I have reviewed nursing documentation, confirmed information with patient, and revised with nursing as necessary.       Melida Gimenez [place], [place]  01/06/19 1656       Kizzie Ide, [place]  01/06/19 1700

## 2019-04-06 ENCOUNTER — Emergency Department
Admission: EM | Admit: 2019-04-06 | Discharge: 2019-04-06 | Disposition: A | Discharge status: Home / Self Care | Payer: Worker's Compensation | Source: Ambulatory Visit | Attending: Family | Admitting: Family

## 2019-04-06 ENCOUNTER — Encounter: Payer: Self-pay | Admitting: Orthopedic Surgery

## 2019-04-06 ENCOUNTER — Emergency Department: Payer: Worker's Compensation | Admitting: Radiology

## 2019-04-06 DIAGNOSIS — Z23 Encounter for immunization: Secondary | ICD-10-CM | POA: Insufficient documentation

## 2019-04-06 DIAGNOSIS — S6992XA Unspecified injury of left wrist, hand and finger(s), initial encounter: Secondary | ICD-10-CM

## 2019-04-06 DIAGNOSIS — R451 Restlessness and agitation: Secondary | ICD-10-CM

## 2019-04-06 DIAGNOSIS — Y9389 Activity, other specified: Secondary | ICD-10-CM | POA: Insufficient documentation

## 2019-04-06 DIAGNOSIS — W312XXA Contact with powered woodworking and forming machines, initial encounter: Secondary | ICD-10-CM | POA: Insufficient documentation

## 2019-04-06 DIAGNOSIS — S61412A Laceration without foreign body of left hand, initial encounter: Secondary | ICD-10-CM | POA: Insufficient documentation

## 2019-04-06 DIAGNOSIS — W269XXA Contact with unspecified sharp object(s), initial encounter: Secondary | ICD-10-CM

## 2019-04-06 DIAGNOSIS — F17210 Nicotine dependence, cigarettes, uncomplicated: Secondary | ICD-10-CM | POA: Insufficient documentation

## 2019-04-06 DIAGNOSIS — F419 Anxiety disorder, unspecified: Secondary | ICD-10-CM

## 2019-04-06 DIAGNOSIS — Y9289 Other specified places as the place of occurrence of the external cause: Secondary | ICD-10-CM | POA: Insufficient documentation

## 2019-04-06 DIAGNOSIS — Y990 Civilian activity done for income or pay: Secondary | ICD-10-CM | POA: Insufficient documentation

## 2019-04-06 MED ORDER — IBUPROFEN 600 MG PO TABS *I*
600.0000 mg | ORAL_TABLET | Freq: Once | ORAL | Status: AC
Start: 2019-04-06 — End: 2019-04-06
  Administered 2019-04-06: 600 mg via ORAL
  Filled 2019-04-06: qty 1

## 2019-04-06 MED ORDER — LIDOCAINE HCL (PF) 1 % IJ SOLN *I*
5.0000 mL | Freq: Once | INTRAMUSCULAR | Status: AC
Start: 2019-04-06 — End: 2019-04-06
  Administered 2019-04-06: 5 mL via INTRADERMAL
  Filled 2019-04-06: qty 5

## 2019-04-06 MED ORDER — CEPHALEXIN 500 MG PO CAPS *I*: 500.0000 mg | ORAL_CAPSULE | Freq: Two times a day (BID) | ORAL | 0 refills | Status: AC

## 2019-04-06 MED ORDER — TETANUS-DIPHTH-ACELL PERT 5-2.5-18.5 LF-MCG/0.5 IM SUSP *WRAPPED*
0.5000 mL | Freq: Once | INTRAMUSCULAR | Status: AC
Start: 2019-04-06 — End: 2019-04-06
  Administered 2019-04-06: 0.5 mL via INTRAMUSCULAR
  Filled 2019-04-06: qty 0.5

## 2019-04-06 MED ORDER — CEFAZOLIN 1000 MG IN STERILE WATER 10ML SYRINGE *I*
1000.0000 mg | PREFILLED_SYRINGE | Freq: Once | INTRAVENOUS | Status: AC
Start: 2019-04-06 — End: 2019-04-06
  Administered 2019-04-06: 1000 mg via INTRAVENOUS
  Filled 2019-04-06: qty 10

## 2019-04-06 NOTE — ED Procedure Documentation (Addendum)
Procedures   Laceration repair  Performed by: Bradd Burner, NP  Authorized by: Bradd Burner, NP     Consent:     Consent obtained:  Verbal    Consent given by:  Patient    Risks discussed:  Infection, pain, retained foreign body, need for additional repair, poor cosmetic result, poor wound healing and nerve damage  Universal protocol:     Patient identity confirmed:  Verbally with patient and arm band  Anesthesia (see MAR for exact dosages):     Anesthesia method:  Local infiltration    Local anesthetic:  Lidocaine 1% w/o epi  Laceration details:     Location:  Finger    Finger location:  L long finger (Left index finger)  Pre-procedure details:     Preparation:  Patient was prepped and draped in usual sterile fashion  Exploration:     Hemostasis achieved with:  Direct pressure    Wound exploration: wound explored through full range of motion and entire depth of wound probed and visualized      Wound extent: no foreign bodies/material noted      Contaminated: no    Treatment:     Area cleansed with:  Chlorhexidine and saline    Amount of cleaning:  Standard    Irrigation solution:  Sterile saline    Irrigation volume:  100    Irrigation method:  Pressure wash and syringe    Visualized foreign bodies/material removed: no    Skin repair:     Repair method: Surgicel and Polysporin ointment.  Post-procedure details:     Dressing:  Antibiotic ointment, non-adherent dressing, adhesive bandage and bulky dressing    Patient tolerance of procedure:  Tolerated with difficulty  Comments:      Patient's wounds infiltrated with lidocaine 1%, so patient was able to tolerate cleansing and debridement of avulsed skin.  Distally infiltration patient poorly tolerated procedure.  Wound extensively cleansed and dressed with Polysporin.  Surgicel dressing applied to the finger to help stop the bleeding.  Patient will be started on prophylactic course of Keflex and follow-up with orthopedics.        Bradd Burner, NP     Bradd Burner, NP  04/06/19  2122       Bradd Burner, NP  04/06/19 2123

## 2019-04-06 NOTE — ED Provider Notes (Signed)
History     Chief Complaint   Patient presents with    Laceration     HPI   33 year old male with a history of anxiety, GERD, kidney stone and herniated cervical disc presents to the emergency department with laceration to his left hand index and middle fingers from a table saw prior to arrival.  Patient is able to move his fingers with sensation intact.  Both fingers have avulsions of the nail bed.  Patient will need a tetanus booster.    Medical/Surgical/Family History     Past Medical History:   Diagnosis Date    Anxiety     GERD (gastroesophageal reflux disease)     Herniated cervical disc     Kidney stone         There is no problem list on file for this patient.           Past Surgical History:   Procedure Laterality Date    APPENDECTOMY      Left knee arthroscopy      PR CYSTO/URETERO/PYELOSCOPY, DX Left 04/22/2018    Procedure: URETEROSCOPY WITH HOLMIUM LASER;  Surgeon: Randall Hiss, MD;  Location: [facility] OR;  Service: Urology    PR CYSTOSCOPY,INSERT URETHRAL STENT Left 04/22/2018    Procedure: CYSTOSCOPY URETERAL STENT;  Surgeon: Randall Hiss, MD;  Location: [facility];  Service: Urology     Family History   Problem Relation Age of Onset    GERD Father           Social History     Tobacco Use    Smoking status: Current Every Day Smoker     Packs/day: 1.00    Smokeless tobacco: Never Used    Tobacco comment: vape   Substance Use Topics    Alcohol use: Yes     Comment: occasional     Drug use: No     Living Situation     Questions Responses    Patient lives with Parent(s)    Homeless No    Caregiver for other family member     External Services     Employment     Domestic Violence Risk                 Review of Systems   Review of Systems   Constitutional: Negative for activity change, chills, diaphoresis, fatigue and fever.   HENT: Negative for congestion and rhinorrhea.    Eyes: Negative for pain and redness.   Respiratory: Negative for cough, chest tightness and  shortness of breath.    Cardiovascular: Negative for chest pain, palpitations and leg swelling.   Gastrointestinal: Negative for abdominal distention and abdominal pain.   Musculoskeletal: Positive for arthralgias. Negative for neck pain and neck stiffness.   Skin: Positive for wound.   Allergic/Immunologic: Negative for environmental allergies.   Neurological: Negative for dizziness, syncope, weakness, light-headedness, numbness and headaches.   Psychiatric/Behavioral: Positive for agitation. Negative for behavioral problems and decreased concentration. The patient is nervous/anxious.    All other systems reviewed and are negative.      Physical Exam     Triage Vitals  Triage Start: Start, (04/06/19 1226)   First Recorded BP: 163/55, Resp: 20, Temp: 36.7 C (98.1 F), Temp src: TEMPORAL Oxygen Therapy SpO2: 95 %, Oximetry Source: Rt Hand, O2 Device: None (Room air), Heart Rate: 99, (04/06/19 1222) Heart Rate (via Pulse Ox): 99, (04/06/19 1222).      Physical Exam  Vitals signs  and nursing note reviewed.   Constitutional:       General: He is not in acute distress.     Appearance: Normal appearance. He is not ill-appearing.   HENT:      Head: Normocephalic and atraumatic.      Nose: Nose normal. No congestion or rhinorrhea.      Mouth/Throat:      Mouth: Mucous membranes are moist.   Eyes:      Extraocular Movements: Extraocular movements intact.      Conjunctiva/sclera: Conjunctivae normal.   Neck:      Musculoskeletal: Normal range of motion and neck supple.   Cardiovascular:      Rate and Rhythm: Normal rate and regular rhythm.      Pulses: Normal pulses.      Heart sounds: Normal heart sounds.   Pulmonary:      Effort: Pulmonary effort is normal.      Breath sounds: Normal breath sounds.   Abdominal:      General: There is no distension.      Palpations: Abdomen is soft.      Tenderness: There is no abdominal tenderness.   Musculoskeletal: Normal range of motion.         General: Tenderness and deformity  present.   Skin:     General: Skin is warm and dry.      Capillary Refill: Capillary refill takes less than 2 seconds.      Comments: Avulsions left index and middle finger   Neurological:      General: No focal deficit present.      Mental Status: He is alert and oriented to person, place, and time.         Medical Decision Making   Patient seen by me on:  04/06/2019    Assessment:  33 year old male patient presents emergency department after sustaining a laceration/avulsion injury of his left middle and index finger from a table saw prior to arrival.  The patient appears very anxious and nervous.      Differential diagnosis:  Fingertip avulsion, acute osseous abnormality    Plan:  Orders Placed This Encounter      Carter pillow      * Hand LEFT standard PA, Lateral, and Oblique views      Tdap (BOOSTRIX): tetanus, diphtheria, and accellar pertussis injection 0.5 mL      ibuprofen (ADVIL,MOTRIN) tablet 600 mg      ceFAZolin Sodium (ANCEF) syringe 1,000 mg      lidocaine PF 1 % injection 5 mL      cephalexin (KEFLEX) 500 MG capsule    Independent review of: XRays    ED Course and Disposition:  X-ray showed acute soft tissue and osseous defect in the left index finger.  Excess skin debrided on index and middle fingers- see procedure note.  The patient will follow up with Dr. Romeo Apple'Donnell, orthopedic hand specialist.    I had a detailed discussion with the patientregarding the historical points, exam findings, and any diagnostic results supporting the discharge diagnosis. Based on the patient's history, exam, and diagnositic evaluation, there is no indication for further emergent intervention or inpatient treatment. Verbal and written discharge instructions were provided. Patient was encouraged to return for any worsening symptoms, persisting symptoms, or any other concerns. Patient was provided the opportunity to ask questions. Patient was advised to follow-up as directed in discharge instructions.    Discussed case &  plan with Dr. Tobe Sosardina who also an apparently evaluated the  patient and agrees course of care and plan.    Imaging:   * Hand LEFT standard PA, Lateral, and Oblique views   Final Result        Osseous and soft tissue defect involving the tuft of the left second digit        END OF IMPRESSION        [facility] submits this DICOM format image data and final report to the [facility], an independent secure electronic health information exchange, on a reciprocally searchable basis (with patient authorization) for a minimum of 12 months after exam     date.            Glynn OctaveAmy Luster Hechler, NP          Glynn OctaveHull, Simara Rhyner, NP  04/06/19 2119

## 2019-04-06 NOTE — ED Triage Notes (Signed)
Patient sustained a laceration to the finger tip to the left hand. This injury occurred PTA. Bleeding controlled at this time.        Triage Note   Polo Riley, RN

## 2019-04-06 NOTE — Discharge Instructions (Signed)
Keep wound clean and dry.  Follow-up with Dr. Candis Shine, call his office for an appointment.  Monitor for signs and symptoms of infection such as increased redness, swelling and purulent drainage.  Call or return sooner with any concerns.  He may use Advil or Tylenol as needed for pain.

## 2019-04-06 NOTE — ED Provider Progress Notes (Signed)
ED Provider Progress Note    I have seen and examined the patient and agree with the advanced practice practitioner's assessment and plan on exam the patient has avulsion to the nail of and needs xray.        Bary Leriche, MD, 04/06/2019, 1:51 PM     Bary Leriche, MD  04/06/19 1351

## 2019-04-09 ENCOUNTER — Encounter: Payer: Self-pay | Admitting: Orthopedic Surgery

## 2019-04-09 ENCOUNTER — Ambulatory Visit: Payer: Worker's Compensation | Admitting: Orthopedic Surgery

## 2019-04-09 VITALS — BP 125/78 | HR 90 | Ht 71.0 in | Wt 275.0 lb

## 2019-04-09 DIAGNOSIS — S61213A Laceration without foreign body of left middle finger without damage to nail, initial encounter: Secondary | ICD-10-CM

## 2019-04-09 DIAGNOSIS — S61211A Laceration without foreign body of left index finger without damage to nail, initial encounter: Secondary | ICD-10-CM

## 2019-04-09 NOTE — Progress Notes (Signed)
ORTHOPAEDIC HAND SURGERY - HISTORY & PHYSICAL     CC: Left index and middle finger tablesaw injuries    HPI: [name] is a 33 y.o. male who presents with left index and middle finger tablesaw injuries.  Date of injury was 04/06/2019.  He was using a table saw at work when the injury occurred.  [facility] and dressings were placed.  He follows up me today.  He has kept the dressings intact.  He is on oral antibiotics.  He reports pain but denies fevers or chills.  He reports he is extraordinarily anxious and very worried about the hand.  He is unable to look at the hand during the dressing removal.     He smokes 1 pack of cigarettes per day.    Medical History:    Past Medical History:   Diagnosis Date    Anxiety     GERD (gastroesophageal reflux disease)     Herniated cervical disc     Kidney stone        Past Surgical History:   Procedure Laterality Date    APPENDECTOMY      Left knee arthroscopy      PR CYSTO/URETERO/PYELOSCOPY, DX Left 04/22/2018    Procedure: URETEROSCOPY WITH HOLMIUM LASER;  Surgeon: Ancil BoozerWachterman, Jared Behn, MD;  Location: [facility];  Service: Urology    PR CYSTOSCOPY,INSERT URETHRAL STENT Left 04/22/2018    Procedure: CYSTOSCOPY URETERAL STENT;  Surgeon: Ancil BoozerWachterman, Jared Behn, MD;  Location: [facility] OR;  Service: Urology       Allergies   Allergen Reactions    Peppers Anaphylaxis    Morphine Nausea And Vomiting    Percocet [Oxycodone-Acetaminophen] Nausea And Vomiting         Current Outpatient Medications:     cephalexin (KEFLEX) 500 MG capsule, Take 1 capsule (500 mg total) by mouth 2 times daily for 5 days, Disp: 10 capsule, Rfl: 0    traMADol-acetaminophen (ULTRACET) 37.5-325 MG per tablet, Take 1 tablet by mouth, Disp: , Rfl:     cyclobenzaprine (FLEXERIL) 10 MG tablet, Take 10 mg by mouth, Disp: , Rfl:     Social History     Socioeconomic History    Marital status: Single     Spouse name: Not on file    Number of children: Not on file     Years of education: Not on file    Highest education level: Not on file   Occupational History    Not on file   Tobacco Use    Smoking status: Current Every Day Smoker     Packs/day: 1.00    Smokeless tobacco: Never Used    Tobacco comment: vape   Substance and Sexual Activity    Alcohol use: Yes     Comment: occasional     Drug use: No    Sexual activity: Not on file   Social History Narrative    Not on file         Family History   Problem Relation Age of Onset    GERD Father        ROS: A 7 point review of systems was performed and was negative with the exception of left index and middle finger tablesaw injuries.    Please note that the patient's intake questionnaire, including medical history, surgical history, allergies, current medications, and review of systems has been independently reviewed, and has been scanned and loaded electronically into  the chart.    Exam:  BP 125/78    Pulse 90    Ht 1.803 m (5\' 11" ) Comment: stated   Wt 124.7 kg (275 lb) Comment: stated   BMI 38.35 kg/m    Constitutional: No acute distress, alert, oriented; very anxious and unwilling to look at the fingers  MSK:   left upper extremity   Index finger  Warm and well perfused  There is a small laceration of the tip of the pulp, about 5 mm.  There is some dried blood.  No sutures.  No erythema, no drainage.  Difficult to assess if nail plate is intact as there is dried blood throughout the region and the patient does not tolerate exam well  Sensation intact to light touch  Patient is extraordinarily hesitant to move the finger and takes extensive coaching to demonstrate intact flexion and extension of the PIP and DIP joints  Middle finger   Warm and well perfused  There is a small laceration of the tip of the pulp, about 5 mm.  There is some dried blood.  No sutures.  No erythema, no drainage.  Difficult to assess if nail plate is intact as there is dried blood throughout the region and the patient does not tolerate exam  well  Sensation intact to light touch  Patient is extraordinarily hesitant to move the finger and takes extensive coaching to demonstrate intact flexion and extension of the PIP and DIP joints    Imaging: Multiple plain views of the left hand obtained 04/06/2019 are independently reviewed by me today.  There is a soft tissue defect of the tip of the index finger with a very subtle tuft fracture.  Middle finger also with a very subtle soft tissue injury but tuft appears intact.    Assessment: [name] is a 33 y.o. male who presents with left hand index and middle finger tablesaw injuries    Plan: The patient is now 3 days out from left hand injury from a table saw.  There are soft tissue defect over the tips of the index and middle fingers.  The soft tissue wounds appear quite small only a few millimeters in size.  X-rays demonstrate very subtle cortical injury to the tip of the tuft of the index finger.  No signs of infection today.  My greatest concern is that the patient is extraordinarily anxious and unwilling to look at the fingers today.  He notes that if he does do this he will faint.  He is also very unwilling to attempt to move the associated fingers.  I had a lengthy conversation with him in regards to the injury.  Overall the injuries are rather benign and I cautioned him that this degree of anxiety and unwillingness to move the fingers will likely result in substantial future finger stiffness and hypersensitivity.  I applied a new dressing today with Xeroform and Coban to each finger.  I stressed the significance of working on finger motion to avoid potentially irreversible future finger stiffness.  I discussed the option of hand therapy referral.  He prefers to attempt motion on his own and if not making any gains I requested that he call the office in the coming week or 2.  He may continue oral antibiotics until completion.  I would like to see him back in 7 to 10 days time to reassess the wounds and  ensure he is making progress with finger motion.  Questions elicited and answered.  Worker's Compensation  Date of injury: 04/06/19    Working status: Not working  Prognosis/disability: 100% temporary disability of the left upper extremity    Return in about 10 days (around 04/19/2019).    Pennie BanterMarc J. Romeo Apple'Donnell, MD  Assistant Professor of Clinical Orthopaedics  Division of Hand, Wrist, & Upper Extremity  [facility]   Office: [phone]    This note was dictated using speech recognition software.  A thorough attempt was made to proof read and correct any errors.

## 2019-04-13 ENCOUNTER — Telehealth: Payer: Self-pay | Admitting: Sports Medicine

## 2019-04-13 NOTE — Telephone Encounter (Signed)
Alic called due to dressing being stuck onto fingertips while trying to change it today. States when he has been changing it this week, it rips eshcar off and bleeds and he is concerned it will happen again.  I asked him to soak dressing in soapy water or diluted H2O2 and then take dressing off and redress.  He agrees to attempting this.    He denies any redness/purulence, f/c.  If he has continued significant bleeding with changes he will call the office tomorrow.    Zachary George, MD  04/13/19

## 2019-04-21 ENCOUNTER — Encounter: Payer: Self-pay | Admitting: Orthopedic Surgery

## 2019-04-21 ENCOUNTER — Ambulatory Visit: Payer: Worker's Compensation | Admitting: Orthopedic Surgery

## 2019-04-21 VITALS — BP 137/77 | HR 92 | Ht 71.0 in | Wt 275.0 lb

## 2019-04-21 DIAGNOSIS — S61211A Laceration without foreign body of left index finger without damage to nail, initial encounter: Secondary | ICD-10-CM

## 2019-04-21 DIAGNOSIS — S61213A Laceration without foreign body of left middle finger without damage to nail, initial encounter: Secondary | ICD-10-CM

## 2019-04-21 NOTE — Progress Notes (Signed)
Attending Addendum    I have seen and examined the patient myself, and I agree with the history, physical exam, and plan as documented by Kara Dies, PA.    Artice Bergerson is a 33 y.o. male who presents for follow-up of left index and middle finger tablesaw injury.  Pain is substantially improved.  He has been working on finger motion and doing local wound care.    The wounds appear to be healing appropriately today.  There is still an open area of the tip of the index finger.  The middle finger wound appears to be healed over.  Nails are appropriately in place.  Able to make a composite fist.  No signs of infection.    Patient doing very well after his left index and middle finger injuries.  He should continue wound care of the index finger for the next couple of days.  I believe these will heal uneventfully.  Continue to work on finger motion.  Plan to reevaluate in approximately 2 to 3 weeks time, and likely return to work thereafter.    Worker's Compensation  Date of injury: 04/06/19                   Working status: Not working  Prognosis/disability: 100% temporary disability of the left upper extremity    Girtha Rm. Candis Shine, MD  Assistant Professor of Clinical Orthopaedics  Division of Hand, [place] [facility]   Office: [phone]    Orthopaedic Progress Note    HPI:  Brendon Christoffel returns in follow-up to a tablesaw injury sustained to his left index and middle fingers.  Patient reports he is doing well he is changing the bandages every 48 hours.  He reports only time they are painful is when he is trying to remove the Xeroform from the wounds.    Physical Exam: No acute distress, responding to questions appropriately and appearing stated age.  Bandages were removed without incident.  There is good early eschar to both digits.  There is a scant amount of sanguinous drainage from the index finger.  Nails are growing nicely and remain tucked underneath the eponychial  fold.  Sensation to light touch is intact.  There is no malodor.  Brisk capillary refill noted distally.    Assessment:   2 weeks status post left index and middle finger tablesaw injury clinically progressing nicely.  There is early eschar and adequate soft tissue coverage.    Plan:   Daily soaks with bandage change every 48 hours.  He will let no longer needs to keep the middle finger covered but will continue to keep the left index finger covered with Xeroform and Covan.  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  Patient was seen and eval by attending physician Dr. Candis Shine      My findings and impression were discussed with the patient. Questions were invited and answered to the patients satisfaction.   Patient will call with increasing questions or concerns otherwise.

## 2019-05-07 ENCOUNTER — Encounter: Payer: Self-pay | Admitting: Orthopedic Surgery

## 2019-05-07 ENCOUNTER — Ambulatory Visit: Payer: Worker's Compensation | Admitting: Orthopedic Surgery

## 2019-05-07 VITALS — BP 123/86 | HR 91 | Ht 71.0 in | Wt 275.0 lb

## 2019-05-07 DIAGNOSIS — S61213A Laceration without foreign body of left middle finger without damage to nail, initial encounter: Secondary | ICD-10-CM

## 2019-05-07 DIAGNOSIS — S61211A Laceration without foreign body of left index finger without damage to nail, initial encounter: Secondary | ICD-10-CM

## 2019-05-07 NOTE — Progress Notes (Signed)
Orthopedic Surgery Progress Note    CC: work-related tablesaw injury to his left index and middle fingers    History: [name] is a 33 y.o. male who presents for follow-up 1 month status post work-related tablesaw injury to his left index and middle fingers. Patient denies fevers, chills, nausea, vomiting.  He notes some continued drainage from the tip of the index finger which is bloody in nature.  He continues to use dressing with Xeroform and Coban.    Exam:  Constitutional: No acute distress, alert, oriented  MSK:  left upper extremity   Middle finger wound healing well with no signs of infection and nail in place  Index finger wound with granulation throughout and some dried blood.  No erythema, no current drainage.  Warm and well perfused  Brisk capillary refill throughout  Baseline motor and sensory exams  Patient is able to make a composite fist    Assessment: [name] is a 33 y.o. male with 1 month status post work-related table saw injury to his left index and middle fingers    Plan: Patient overall is doing very well with his left index and middle finger tablesaw injuries related to work.  The middle finger is healing well.  The index finger is lagging behind somewhat.  However the wounds appear to be healed over today in the office.  Recommend he stop wearing the dressing and expose the finger to different temperatures and textures to work on skin desensitization.  He has excellent motion today.  Furthermore he may do mechanical debridement of the fingers at home by soaking them in warm soapy water for 15 minutes and removing any dead tissue from the fingers.  Questions elicited and answered.  I think it is reasonable for him to return to work in 2 weeks from now and work note provided.  Will plan to reevaluate in the office in about 4 weeks time.    Worker's Compensation  Date of injury:04/06/19  Working status:Not working  Prognosis/disability:100% temporary disability of the  left upper extremity until 05/18/2019 then full release to work note provided    Return in about 4 weeks (around 06/04/2019).    Girtha Rm. Candis Shine, MD  Assistant Professor of Clinical Orthopaedics  Division of Hand, [place] [facility]   Office: [phone]    This note was dictated using speech recognition software.  A thorough attempt was made to proof read and correct any errors.

## 2019-05-12 ENCOUNTER — Ambulatory Visit: Payer: Self-pay | Admitting: Urology

## 2019-06-09 ENCOUNTER — Ambulatory Visit: Payer: Self-pay | Admitting: Orthopedic Surgery

## 2019-08-17 ENCOUNTER — Ambulatory Visit
Admission: AD | Admit: 2019-08-17 | Discharge: 2019-08-17 | Disposition: A | Discharge status: Home / Self Care | Payer: MEDICAID | Source: Ambulatory Visit | Attending: Family Medicine | Admitting: Family Medicine

## 2019-08-17 DIAGNOSIS — B349 Viral infection, unspecified: Secondary | ICD-10-CM

## 2019-08-17 DIAGNOSIS — Z20828 Contact with and (suspected) exposure to other viral communicable diseases: Secondary | ICD-10-CM | POA: Insufficient documentation

## 2019-08-17 DIAGNOSIS — J069 Acute upper respiratory infection, unspecified: Secondary | ICD-10-CM

## 2019-08-17 DIAGNOSIS — F17210 Nicotine dependence, cigarettes, uncomplicated: Secondary | ICD-10-CM | POA: Insufficient documentation

## 2019-08-17 LAB — DATE/TIME NOT PROVIDED

## 2019-08-17 NOTE — UC Provider Note (Addendum)
History     Chief Complaint   Patient presents with    Nasal Congestion    Sore Throat    Cough    Hand Pain     left middle finger     Started 5-6 days with runny nose, sore throat which is better, cough. Had fever on Thursday. Work wants an evaluation done. Is a smoker. In June sliced the LMF and LIF and was repaired. Since then the LMF feels swollen and hurts to bend or touch and hurts to use it. Follow up with ortho in July. The nails have grown back.          Medical/Surgical/Family History     Past Medical History:   Diagnosis Date    Anxiety     GERD (gastroesophageal reflux disease)     Herniated cervical disc     Kidney stone         There is no problem list on file for this patient.           Past Surgical History:   Procedure Laterality Date    APPENDECTOMY      Left knee arthroscopy      PR CYSTO/URETERO/PYELOSCOPY, DX Left 04/22/2018    Procedure: URETEROSCOPY WITH HOLMIUM LASER;  Surgeon: Randall Hiss, MD;  Location: [facility];  Service: Urology    PR CYSTOSCOPY,INSERT URETHRAL STENT Left 04/22/2018    Procedure: CYSTOSCOPY URETERAL STENT;  Surgeon: Randall Hiss, MD;  Location: [facility];  Service: Urology     Family History   Problem Relation Age of Onset    GERD Father           Social History     Tobacco Use    Smoking status: Current Every Day Smoker     Packs/day: 0.25     Years: 15.00     Pack years: 3.75     Types: Cigarettes    Smokeless tobacco: Never Used   Substance Use Topics    Alcohol use: Yes     Alcohol/week: 1.0 standard drinks     Types: 1 Shots of liquor per week     Comment: 2 times month    Drug use: No     Living Situation     Questions Responses    Patient lives with Parent(s)    Homeless No    Caregiver for other family member No    External Services None    Employment Employed    Domestic Violence Risk No                Review of Systems   Review of Systems   Constitutional: Positive for fever.   HENT: Positive for congestion, rhinorrhea  and sore throat.    Respiratory: Positive for cough.    Musculoskeletal: Negative for myalgias.   Neurological: Negative for headaches.   All other systems reviewed and are negative.      Physical Exam   Triage Vitals  Triage Start: Start, (08/17/19 9326)   First Recorded  ,    .  Temp 98.0; resp 16; hr5 85; o2 sat 98%.    Physical Exam  Vitals signs and nursing note reviewed.   Constitutional:       General: He is not in acute distress.     Appearance: He is well-developed. He is not ill-appearing.   HENT:      Right Ear: Tympanic membrane normal.      Left Ear: Tympanic  membrane normal.      Nose: No congestion or rhinorrhea.      Mouth/Throat:      Mouth: Mucous membranes are moist.      Pharynx: Oropharynx is clear. Uvula midline. No pharyngeal swelling, oropharyngeal exudate or posterior oropharyngeal erythema.   Neck:      Musculoskeletal: Normal range of motion and neck supple.   Pulmonary:      Effort: Pulmonary effort is normal. No respiratory distress.      Breath sounds: Normal breath sounds. No wheezing.      Comments: Speaking in full sentences; strong cig smoke noted.  Musculoskeletal: Normal range of motion.         General: No swelling or tenderness.      Comments: lif and LMF are not swollen; cap refill wnl; appear normal.   Skin:     General: Skin is warm and dry.   Neurological:      Mental Status: He is alert and oriented to person, place, and time.   Psychiatric:         Mood and Affect: Mood normal.         Behavior: Behavior normal.         Thought Content: Thought content normal.         Judgment: Judgment normal.          Medical Decision Making      Amount and/or Complexity of Data Reviewed  Clinical lab tests: ordered      Labs Reviewed   COVID-19 PCR       Initial Evaluation:  ED First Provider Contact     Date/Time Event User Comments    08/17/19 0902 ED First Provider Contact Romana JuniperVANAUKER, Arnav Cregg Initial Face to Face Provider Contact          Patient was seen on:  08/17/2019        Assessment:  33 y.o.[facility] Started 5-6 days with runny nose, sore throat which is better, cough. Had fever on Thursday. Work wants an evaluation done. Is a smoker. In June sliced the LMF and LIF and was repaired. Since then the LMF feels swollen and hurts to bend or touch and hurts to use it. Follow up with ortho in July. The nails have grown back.    Differential Diagnosis includes:  Uri  Bronchitis  covid    Plan:   Need to follow up with ortho re : fingers.  No smoking   Viral Illness:  -need to rest, your body needs extra sleep,   -need to drink lots of fluids especially water, when youre sick,  -gargle with warm salt water especially in the morning when you first wake up, this will help your throat.  -take Tylenol for aches and fever. (do not exceed more than 3000 mg per day, so look at all medication labels to add up the mg/ day).  -motrin 600mg  every 8 hrs if needed for aches, pains and fever.  -take an allergy medication: claritan, or Zyrtec or allegra: this will help with nasal symptoms.  -you need to use saline nasal spray to help loosen nasal secretions.  -mucinex dm for cough and congestion.  -nasal decongestant medication for nasal congestion: Sudafed x 3 days  -cool mist vaporizer/ humidifier  -warm tea with honey (both honey and elderberry syrup have natural anti-viral properties)     You were tested for covid and we will call you with results.  REGARDLESS:  1. You should be in quarantine for 14 days.   2. The coronavirus is spread via droplets.   3. Please wash your hands frequently, for at least 20 seconds each time, or use hand sanitizer that is at least 60% alcohol.   4. Clean all high-touch surfaces daily (this includes doorknobs, counters, tables, bathroom fixtures, toilets, phones, bedside tables). Follow the instructions on your household spray or wipes.   5. Do not go to school or work for 14 days; notify your employer that you will not  be in, and call them before returning to work to make sure that this is okay.   6. Check your temperature twice a day, morning and night. Fever is a temperature of 100.4 or higher.   7. If you develop any respiratory symptoms, call the office right away.   8. Please do not take any bus, Benedetto Goad, medical transportation, taxi, or any other public transportation.   9. You should not be in contact with the public.   10. Separate yourself from other people and animals in your home. As much as possible, you should be in a specific room away from others in your home. Do not share items such as dishes and utensils with others unless they are thoroughly washed with soap and water.   11. If going outdoors is unavoidable, please cover your face with a mask and use hand sanitizer.   12. You should not go into shared kitchens or bathrooms in your home.   13. Cancel all unnecessary appointments.   14. Do not go to the gym.   15. Do not go to a place of worship.   16. Call ahead before visiting the doctor's office.      IF at any point you have increased shortness of breath, fevers that are not decreasing with tylenol/ibuprofen or worsening symptoms you should seek further immediate care         Final Diagnosis  Final diagnoses:   [J06.9] Viral URI with cough (Primary)         Treasa School, NP              Marylen Ponto, NP  08/17/19 0913       Marylen Ponto, NP  08/17/19 [phone]

## 2019-08-17 NOTE — Discharge Instructions (Addendum)
No smoking   Need to follow up with ortho re : fingers.  Viral Illness:  -need to rest, your body needs extra sleep,   -need to drink lots of fluids especially water, when youre sick,  -gargle with warm salt water especially in the morning when you first wake up, this will help your throat.  -take Tylenol for aches and fever. (do not exceed more than 3000 mg per day, so look at all medication labels to add up the mg/ day).  -motrin 600mg  every 8 hrs if needed for aches, pains and fever.  -take an allergy medication: claritan, or Zyrtec or allegra: this will help with nasal symptoms.  -you need to use saline nasal spray to help loosen nasal secretions.  -mucinex dm for cough and congestion.  -nasal decongestant medication for nasal congestion: Sudafed x 3 days  -cool mist vaporizer/ humidifier  -warm tea with honey (both honey and elderberry syrup have natural anti-viral properties)     You were tested for covid and we will call you with results.  REGARDLESS:   1. You should be in quarantine for 14 days.   2. The coronavirus is spread via droplets.   3. Please wash your hands frequently, for at least 20 seconds each time, or use hand sanitizer that is at least 60% alcohol.   4. Clean all high-touch surfaces daily (this includes doorknobs, counters, tables, bathroom fixtures, toilets, phones, bedside tables). Follow the instructions on your household spray or wipes.   5. Do not go to school or work for 14 days; notify your employer that you will not be in, and call them before returning to work to make sure that this is okay.   6. Check your temperature twice a day, morning and night. Fever is a temperature of 100.4 or higher.   7. If you develop any respiratory symptoms, call the office right away.   8. Please do not take any bus, Melburn Popper, medical transportation, taxi, or any other public transportation.   9. You should not be in contact with the public.   10. Separate yourself from other people and animals in your  home. As much as possible, you should be in a specific room away from others in your home. Do not share items such as dishes and utensils with others unless they are thoroughly washed with soap and water.   11. If going outdoors is unavoidable, please cover your face with a mask and use hand sanitizer.   12. You should not go into shared kitchens or bathrooms in your home.   63. Cancel all unnecessary appointments.   14. Do not go to the gym.   15. Do not go to a place of worship.   16. Call ahead before visiting the doctor's office.      IF at any point you have increased shortness of breath, fevers that are not decreasing with tylenol/ibuprofen or worsening symptoms you should seek further immediate care

## 2019-08-17 NOTE — ED Triage Notes (Addendum)
Patient reports last Wednesday he started having a runny nose, cough and sore throat. Patient has taken OTC DayQuil to help alleviate symptoms with some effect noted. Patient does not have a PCP and needs to be further evaluated to return to work. Patient also states for the past month his left middle finger has been hurting him. 3 months ago he cut the fingertip off of that finger and it has been painful ever since then.        Triage Note   Roselyn Bering, LPN

## 2019-08-18 ENCOUNTER — Telehealth: Payer: Self-pay

## 2019-08-18 LAB — COVID-19 PCR

## 2019-08-18 LAB — COVID-19 NAAT (PCR): COVID-19 NAAT (PCR): NEGATIVE

## 2019-08-18 NOTE — Telephone Encounter (Signed)
Called patient and informed them of negative Covid swab result. Patient verbalized understanding of information given.

## 2019-08-18 NOTE — Telephone Encounter (Signed)
-----   Message from Jacques Earthly, [place] sent at 08/18/2019 12:38 PM EDT -----  Inform patient of negative covid test.     If still having symptoms they should continue to quarantine // follow up with PCP or be reevaluation.    If your symptoms are worsening worsening such as shortness of breath, chest pain, fevers that are not reducing with tylenol / motrin, seek further immediate care in the ER

## 2019-09-01 ENCOUNTER — Ambulatory Visit
Admission: AD | Admit: 2019-09-01 | Discharge: 2019-09-01 | Disposition: A | Discharge status: Home / Self Care | Payer: Medicaid Other | Source: Ambulatory Visit | Attending: Urgent Care | Admitting: Urgent Care

## 2019-09-01 DIAGNOSIS — Z20828 Contact with and (suspected) exposure to other viral communicable diseases: Secondary | ICD-10-CM | POA: Insufficient documentation

## 2019-09-01 DIAGNOSIS — R05 Cough: Secondary | ICD-10-CM | POA: Insufficient documentation

## 2019-09-01 DIAGNOSIS — R059 Cough, unspecified: Secondary | ICD-10-CM

## 2019-09-01 MED ORDER — BENZONATATE 200 MG PO CAPS *I*
200.0000 mg | ORAL_CAPSULE | Freq: Three times a day (TID) | ORAL | 0 refills | Status: AC | PRN
Start: 2019-09-01 — End: ?

## 2019-09-01 NOTE — Discharge Instructions (Signed)
If you were exposed to COVID, have no symptoms, and no testing was performed:  You can return to work, wearing a procedural mask while there.  It is important that you monitor for symptoms at home  Check your temperature at home daily for at least 10-14 days  Call your employer if you have concerns or questions      If you were tested for COVID-19 today and you have upper respiratory symptoms:  Do not return to work.  Self-quarantine until you hear your test results.  Stay home and away from any and all public locations  Stay in a specific room at home and use a separate bathroom if possible, from others in your household  Wash your hands frequently  Get plenty of rest and stay hydrated  Clean all surfaces touched frequently (counters, keyboards, doorknobs etc.)  Cover your cough and sneezes  Wear a mask when around other people in your household     You will be contacted by phone about your COVID testing results. Result times are variable, but you will be contacted as soon as possible when they are available.      If you test negative for COVID-19:  §  Check with your employer to obtain clearance to return to work  §  In general, if you have a fever and are ill you should not work even if your COVID-19 test is negative     If your test is positive for COVID-19:  §  It is critically important that you self-quarantine and isolate within your home if possible as well (see quarantine instructions above)  §  Current recommendations are that you can return to work when  The minimum isolation period of 14 days has passed  AND  You have not had a fever for 72 hours without taking medicine to reduce fever (such as ibuprofen or acetaminophen/Tylenol)  AND  You have improving respiratory symptoms (cough, shortness of breath)  §  Call your employer to obtain clearance to return to work.     You should stay in contact with your PCP and follow up with them by phone or virtual visit if able.  You should return to the hospital if  you develop severe symptoms such as shortness of breath or chest pain.     Helpful Phone numbers:  [employer] COVID call center: [phone].  [place] COVID Hotline (9A-9P): 1-888-364-3065

## 2019-09-01 NOTE — UC Provider Note (Signed)
History     Chief Complaint   Patient presents with    Cough     Presents with a cough and runny nose for 3 days, cough is productive, but denies fever or chills or any chest discomfort, patient is a multi-year cig smoker, denies vaping.          Medical/Surgical/Family History     Past Medical History:   Diagnosis Date    Anxiety     GERD (gastroesophageal reflux disease)     Herniated cervical disc     Kidney stone         There is no problem list on file for this patient.           Past Surgical History:   Procedure Laterality Date    APPENDECTOMY      Left knee arthroscopy      PR CYSTO/URETERO/PYELOSCOPY, DX Left 04/22/2018    Procedure: URETEROSCOPY WITH HOLMIUM LASER;  Surgeon: Ancil Boozer, MD;  Location: [facility];  Service: Urology    PR CYSTOSCOPY,INSERT URETHRAL STENT Left 04/22/2018    Procedure: CYSTOSCOPY URETERAL STENT;  Surgeon: Ancil Boozer, MD;  Location: [facility];  Service: Urology     Family History   Problem Relation Age of Onset    GERD Father           Social History     Tobacco Use    Smoking status: Current Every Day Smoker     Packs/day: 0.25     Years: 15.00     Pack years: 3.75     Types: Cigarettes    Smokeless tobacco: Never Used   Substance Use Topics    Alcohol use: Yes     Alcohol/week: 1.0 standard drinks     Types: 1 Shots of liquor per week     Comment: 2 times month    Drug use: No     Living Situation     Questions Responses    Patient lives with Parent(s)    Homeless No    Caregiver for other family member No    External Services None    Employment Employed    Domestic Violence Risk No                Review of Systems   Review of Systems   All other systems reviewed and are negative.      Physical Exam   Triage Vitals  Triage Start: Start, (09/01/19 1820)   First Recorded  ,    .      Physical Exam  Vitals signs (T-97.6 HR-80 O2 Sat-97%) reviewed.   Constitutional:       General: He is not in acute distress.     Appearance: Normal  appearance. He is not ill-appearing.   HENT:      Head: Normocephalic and atraumatic.      Right Ear: Tympanic membrane normal.      Left Ear: Tympanic membrane normal.      Nose: Nose normal.      Mouth/Throat:      Mouth: Mucous membranes are moist.   Neck:      Musculoskeletal: Neck supple.   Cardiovascular:      Rate and Rhythm: Normal rate and regular rhythm.      Pulses: Normal pulses.   Pulmonary:      Effort: Pulmonary effort is normal.      Breath sounds: Normal breath sounds.   Chest:  Chest wall: No tenderness.   Lymphadenopathy:      Cervical: No cervical adenopathy.   Skin:     Capillary Refill: Capillary refill takes less than 2 seconds.   Neurological:      General: No focal deficit present.      Mental Status: He is alert and oriented to person, place, and time.   Psychiatric:         Mood and Affect: Mood normal.         Behavior: Behavior normal.          Medical Decision Making        Initial Evaluation:  ED First Provider Contact     Date/Time Event User Comments    09/01/19 1825 ED First Provider Contact Candance Bohlman Initial Face to Face Provider Contact          Patient was seen on: 09/01/2019        Assessment:  33 y.o.male comes to the [facility] with a cough    Differential Diagnosis includes:  Bronchitis, Covid    Plan:   Labs Reviewed   COVID-19 PCR     Discharge Medication List as of 09/01/2019  7:13 PM        PCP follow up next week.edc  If you were exposed to COVID, have no symptoms, and no testing was performed:   You can return to work, wearing a procedural mask while there.   It is important that you monitor for symptoms at home   Check your temperature at home daily for at least 10-14 days   Call your employer if you have concerns or questions      If you were tested for COVID-19 today and you have upper respiratory symptoms:   Do not return to work.  Self-quarantine until you hear your test results.   Stay home and away from any and all public locations   Stay  in a specific room at home and use a separate bathroom if possible, from others in your household   Wash your hands frequently   Get plenty of rest and stay hydrated   Clean all surfaces touched frequently (counters, keyboards, doorknobs etc.)   Cover your cough and sneezes   Wear a mask when around other people in your household     You will be contacted by phone about your COVID testing results. Result times are variable, but you will be contacted as soon as possible when they are available.      If you test negative for COVID-19:    Check with your employer to obtain clearance to return to work    In general, if you have a fever and are ill you should not work even if your COVID-19 test is negative     If your test is positive for COVID-19:    It is critically important that you self-quarantine and isolate within your home if possible as well (see quarantine instructions above)    Current recommendations are that you can return to work when  o The minimum isolation period of 14 days has passed  AND  o You have not had a fever for 72 hours without taking medicine to reduce fever (such as ibuprofen or acetaminophen/Tylenol)  AND  o You have improving respiratory symptoms (cough, shortness of breath)    Call your employer to obtain clearance to return to work.     You should stay in contact with your PCP and follow up  with them by phone or virtual visit if able.  You should return to the hospital if you develop severe symptoms such as shortness of breath or chest pain.     Helpful Phone numbers:  [facility] call [facility]: [phone].  [place] COVID Hotline (9A-9P): I42328661-5127779345           Final Diagnosis  Final diagnoses:   [R05] Cough (Primary)         Telford NabIMOTHY Blessed Girdner, PA              Telford NabButton, Zeynep Fantroy, [place]  09/01/19 1948

## 2019-09-01 NOTE — ED Triage Notes (Signed)
Patient reports he has had a cough and runny nose for 3 days. He has been taking Robitussin and Dayquil. He currently does not have a PCP. He is looking for evaluation and Covid testing       Triage Note   Clovis Cao, LPN

## 2019-09-03 LAB — COVID-19 PCR

## 2019-09-03 LAB — COVID-19 NAAT (PCR): COVID-19 NAAT (PCR): NEGATIVE

## 2019-11-02 ENCOUNTER — Emergency Department
Admission: EM | Admit: 2019-11-02 | Discharge: 2019-11-02 | Disposition: A | Discharge status: Home / Self Care | Payer: No Typology Code available for payment source | Source: Ambulatory Visit | Attending: Emergency Medicine | Admitting: Emergency Medicine

## 2019-11-02 DIAGNOSIS — Y990 Civilian activity done for income or pay: Secondary | ICD-10-CM | POA: Insufficient documentation

## 2019-11-02 DIAGNOSIS — W260XXA Contact with knife, initial encounter: Secondary | ICD-10-CM | POA: Insufficient documentation

## 2019-11-02 DIAGNOSIS — Y9289 Other specified places as the place of occurrence of the external cause: Secondary | ICD-10-CM | POA: Insufficient documentation

## 2019-11-02 DIAGNOSIS — S51811A Laceration without foreign body of right forearm, initial encounter: Secondary | ICD-10-CM | POA: Insufficient documentation

## 2019-11-02 DIAGNOSIS — S41111A Laceration without foreign body of right upper arm, initial encounter: Secondary | ICD-10-CM

## 2019-11-02 DIAGNOSIS — W268XXA Contact with other sharp object(s), not elsewhere classified, initial encounter: Secondary | ICD-10-CM

## 2019-11-02 DIAGNOSIS — Y9389 Activity, other specified: Secondary | ICD-10-CM | POA: Insufficient documentation

## 2019-11-02 MED ORDER — LIDOCAINE-EPINEPHRINE 1 %-1:100000 IJ SOLN *I*
30.0000 mL | Freq: Once | INTRAMUSCULAR | Status: AC
Start: 2019-11-02 — End: 2019-11-02
  Administered 2019-11-02: 30 mL
  Filled 2019-11-02: qty 40

## 2019-11-02 NOTE — ED Provider Notes (Signed)
History     Chief Complaint   Patient presents with    Laceration     right arm        34 year old male presents to the emergency department for evaluation of a laceration he sustained to the right proximal forearm today.  The patient states he was at work & notes he was pulling up his sleeve when he accidentally lacerated his right arm with a knife he had in his left hand.  No numbness or tingling.  No limitation and ability to range the arm.  He received a Tdap vaccination 6 months ago.    Medical/Surgical/Family History     Past Medical History:   Diagnosis Date    Anxiety     GERD (gastroesophageal reflux disease)     Herniated cervical disc     Kidney stone         There is no problem list on file for this patient.           Past Surgical History:   Procedure Laterality Date    APPENDECTOMY      Left knee arthroscopy      PR CYSTO/URETERO/PYELOSCOPY, DX Left 04/22/2018    Procedure: URETEROSCOPY WITH HOLMIUM LASER;  Surgeon: Ancil Boozer, MD;  Location: [facility];  Service: Urology    PR CYSTOSCOPY,INSERT URETHRAL STENT Left 04/22/2018    Procedure: CYSTOSCOPY URETERAL STENT;  Surgeon: Ancil Boozer, MD;  Location: [facility];  Service: Urology     Family History   Problem Relation Age of Onset    GERD Father           Social History     Tobacco Use    Smoking status: Current Every Day Smoker     Packs/day: 0.25     Years: 15.00     Pack years: 3.75     Types: Cigarettes    Smokeless tobacco: Never Used   Substance Use Topics    Alcohol use: Yes     Alcohol/week: 1.0 standard drinks     Types: 1 Shots of liquor per week     Comment: weekly     Drug use: No     Living Situation     Questions Responses    Patient lives with Parent(s)    Homeless No    Caregiver for other family member No    External Services None    Employment Employed    Domestic Violence Risk No                Review of Systems   Review of Systems   Constitutional: Negative for chills and fever.      Respiratory: Negative for cough.    Cardiovascular: Negative for chest pain.   Gastrointestinal: Negative for abdominal pain.   Skin: Positive for wound.       Physical Exam     Triage Vitals  Triage Start: Start, (11/02/19 1429)   First Recorded BP: 118/68, Resp: 16, Temp: 37 C (98.6 F), Temp src: TEMPORAL Oxygen Therapy SpO2: 96 %, Oximetry Source: Lt Hand, O2 Device: None (Room air), Heart Rate: 94, (11/02/19 1432)  .  Vitals:    11/02/19 1432 11/02/19 1646   BP: 118/68 120/68   BP Location: Left arm    Pulse: 94 72   Resp: 16    Temp: 37 C (98.6 F)    TempSrc: Temporal    SpO2: 96% 99%   Weight: 124.7 kg (275 lb)  Height: 1.803 m (5\' 11" )      Physical Exam  Vitals signs and nursing note reviewed.   HENT:      Head: Normocephalic and atraumatic.   Pulmonary:      Effort: Pulmonary effort is normal.   Skin:     General: Skin is warm and dry.      Capillary Refill: Capillary refill takes less than 2 seconds.      Comments: Right proximal forearm with laceration, see pictures below   Neurological:      General: No focal deficit present.      Mental Status: He is alert.   Psychiatric:         Mood and Affect: Mood normal.                 Laceration repair  Performed by: , NP  Authorized by: Glendell Docker, NP     Consent:     Consent obtained:  Verbal    Consent given by:  Patient    Risks discussed:  Infection  Universal protocol:     Procedure explained and questions answered to patient or proxy's satisfaction: yes      Patient identity confirmed:  Verbally with patient  Anesthesia (see MAR for exact dosages):     Anesthesia method:  Local infiltration    Local anesthetic:  Lidocaine 1% WITH epi  Laceration details:     Location:  Shoulder/arm    Shoulder/arm location:  R lower arm    Length (cm):  7.5  Pre-procedure details:     Preparation:  Patient was prepped and draped in usual sterile fashion  Exploration:     Hemostasis achieved with:  Epinephrine    Wound exploration: wound explored  through full range of motion    Treatment:     Area cleansed with:  Chlorhexidine    Amount of cleaning:  Standard    Irrigation solution:  Sterile saline  Skin repair:     Repair method:  Sutures    Suture size:  5-0    Suture material:  Prolene    Number of sutures:  12  Approximation:     Approximation:  Close    Vermilion border: well-aligned    Post-procedure details:     Dressing:  Antibiotic ointment    Patient tolerance of procedure:  Tolerated well, no immediate complications        Medical Decision Making        Patient seen by me on:  11/02/2019     Assessment:  34 y.o., male comes to the emergency department for evaluation of laceration he sustained to the right proximal forearm today--    Differential diagnosis:  Laceration    Plan:   Orders Placed This Encounter    Laceration repair    lidocaine-EPINEPHrine 1 %-1:100000 injection 30 mL     Independent review of: Existing labs, XRays, CT scans, chart/prior records    ED Course and Disposition:  Wound extensively cleansed with antiseptic solution and irrigated with normal saline. Wound repaired, triple antibiotic ointment & clean, nonadherent dressing applied. Counseled regarding wound care, signs of infection and return precautions.  Advised patient to schedule follow up appointment with PCP in 7-10 days for suture and/or staple removal and wound recheck. Stable for discharge.     Imaging:   No orders to display      Labs:   Labs Reviewed - No data to display  Cherylin Mylar, NP          Author:  Cherylin Mylar, NP       Cherylin Mylar, NP  11/02/19 1851

## 2019-11-02 NOTE — ED Notes (Signed)
Bed: TED-05  Expected date:   Expected time:   Means of arrival:   Comments:  wr

## 2019-11-02 NOTE — ED Triage Notes (Signed)
Pt presents to the ED today from work for c/o laceration to right arm with an exacto knife. Pt c/o feeling "hot" in triage. Unwilling to answer questions in triage        Triage Note   Waldon Reining, RN

## 2019-11-02 NOTE — ED Notes (Signed)
Wound cleaned, polysporin applied with new clean dressing.

## 2019-11-02 NOTE — Discharge Instructions (Signed)
You need to schedule an appointment with your primary care doctor for follow-up in 7-10 days for suture and/or staple removal & wound recheck. If you cannot see your primary care doctor within this time frame, you can be seen at urgent care or back here in emergency to have this completed.    Wound care: After 24-48 hours, remove the dressing & gently wash wound with water and reapply dressing. Apply triple antibiotic ointment (Polysporin or Neosporin) to the wound with dressing changes.  You should ice and elevate injury above heart to alleviate pain & swelling.  Take Tylenol and Motrin for pain control.    Keep the wound clean & covered with a dressing to prevent potential infection. Change the dressing daily and/or as needed (ie: if dressing becomes wet or soiled).    Healing: When skin is injured, there will almost always be a scar no matter what the treatment. To get a better cosmetic result regarding scarring, one of the most important things you can do is WEAR SUNSCREEN and avoid UV exposure for the next 6 months up to 1 year.     Be aware of signs of infection: increasing pain, swelling, new or worsening redness, red streaking, pus-drainage, foul wound smells, new fevers or chills.  If this occurs, you should return to the emergency department or seek medical evaluation at your doctors office. You should also seek evaluation if the wound reopens.    If you were prescribed antibiotics, take the full course and finish the medication as directed even if feeling better. Never skip doses or stop taking the medicine unless directed by your primary care doctor. Incomplete course of antibiotics increases your risk developing antibiotic resistance. If you were prescribed pain medication (ie: Norco, Percocet), do NOT drive or operate any machinery while on these medications.

## 2019-11-11 ENCOUNTER — Ambulatory Visit
Admission: AD | Admit: 2019-11-11 | Discharge: 2019-11-11 | Disposition: A | Discharge status: Home / Self Care | Payer: Medicaid Other | Source: Ambulatory Visit | Attending: Physician Assistant | Admitting: Physician Assistant

## 2019-11-11 DIAGNOSIS — Z4802 Encounter for removal of sutures: Secondary | ICD-10-CM | POA: Insufficient documentation

## 2019-11-11 NOTE — ED Triage Notes (Signed)
Pt presenting to urgent care for suture removal. Sutures were placed 01/11.       Triage Note   Cephus Shelling, LPN

## 2019-11-11 NOTE — Discharge Instructions (Addendum)
12 sutures removed here today    continue to keep it covered when partaking in outdoor activities    If you notice any drainage, redness, warmth, increased pain to the area, temperature >101.5 those are all signs of infection and you should seek medical evaluation

## 2019-11-11 NOTE — UC Provider Note (Signed)
History     Chief Complaint   Patient presents with    Suture / Staple Removal     34 yr old male patient presents to [facility] with a chief complaint of suture removal. Pt has 12 sutures placed on 11/02/19 after cutting arm. Pt denies any complaints. Its noted tetanus was updated 6 months ago.           Medical/Surgical/Family History     Past Medical History:   Diagnosis Date    Anxiety     GERD (gastroesophageal reflux disease)     Herniated cervical disc     Kidney stone         There is no problem list on file for this patient.           Past Surgical History:   Procedure Laterality Date    APPENDECTOMY      Left knee arthroscopy      PR CYSTO/URETERO/PYELOSCOPY, DX Left 04/22/2018    Procedure: URETEROSCOPY WITH HOLMIUM LASER;  Surgeon: Ancil Boozer, MD;  Location: [facility];  Service: Urology    PR CYSTOSCOPY,INSERT URETHRAL STENT Left 04/22/2018    Procedure: CYSTOSCOPY URETERAL STENT;  Surgeon: Ancil Boozer, MD;  Location: [facility];  Service: Urology     Family History   Problem Relation Age of Onset    GERD Father           Social History     Tobacco Use    Smoking status: Current Every Day Smoker     Packs/day: 0.25     Years: 15.00     Pack years: 3.75     Types: Cigarettes    Smokeless tobacco: Never Used   Substance Use Topics    Alcohol use: Yes     Alcohol/week: 1.0 standard drinks     Types: 1 Shots of liquor per week     Comment: weekly     Drug use: No     Living Situation     Questions Responses    Patient lives with Parent(s)    Homeless No    Caregiver for other family member No    External Services None    Employment Employed    Domestic Violence Risk No                Review of Systems   Review of Systems   Constitutional: Negative for chills, diaphoresis, fatigue and fever.   HENT: Negative for congestion and sore throat.    Respiratory: Negative for cough and shortness of breath.    Gastrointestinal: Negative for abdominal pain,  constipation, diarrhea, nausea and vomiting.   Skin: Positive for wound.   Neurological: Negative for headaches.   All other systems reviewed and are negative.      Physical Exam   Triage Vitals  Triage Start: Start, (11/11/19 1629)   First Recorded BP: 139/72, Temp: 36.8 C (98.3 F), Temp src: TEMPORAL Oxygen Therapy SpO2: 98 %, Oximetry Source: Rt Hand, O2 Device: None (Room air), Heart Rate: 101, (11/11/19 1635)  .      Physical Exam  Vitals signs and nursing note reviewed.   Constitutional:       General: He is not in acute distress.     Appearance: Normal appearance. He is well-developed and normal weight. He is not ill-appearing, toxic-appearing or diaphoretic.   HENT:      Head: Normocephalic and atraumatic.   Cardiovascular:  Rate and Rhythm: Normal rate.   Pulmonary:      Effort: Pulmonary effort is normal. No accessory muscle usage or respiratory distress.   Skin:     General: Skin is warm and dry.      Capillary Refill: Capillary refill takes 2 to 3 seconds.      Findings: Wound present. No bruising or erythema.             Comments: 12 sutures intact without drainage or erythema   Neurological:      Mental Status: He is alert and oriented to person, place, and time. Mental status is at baseline.   Psychiatric:         Mood and Affect: Mood normal.         Behavior: Behavior normal.         Thought Content: Thought content normal.         Judgment: Judgment normal.          Medical Decision Making        Initial Evaluation:  ED First Provider Contact     Date/Time Event User Comments    11/11/19 1628 ED First Provider Contact Shellia Carwin, Ave[facility] Harb A Initial Face to Face Provider Contact          Patient was seen on: 11/11/2019        Assessment:  33 y.o.male comes to the Urgent [place] with needing sutures removed  Suture removal  Performed by: Jacques Earthly, PA  Authorized by: Jacques Earthly, PA     Consent:     Consent obtained:  Verbal    Consent given by:  Patient    Risks discussed:  Bleeding,  wound separation and pain    Alternatives discussed:  No treatment  Universal protocol:     Patient identity confirmed:  Verbally with patient  Location:     Location:  Upper extremity    Upper extremity location:  Arm    Arm location:  R lower arm  Procedure details:     Wound appearance:  No signs of infection    Number of sutures removed:  12  Post-procedure details:     Post-removal:  Antibiotic ointment applied and dressing applied    Sutures placed in this facility: at [facility].      Patient tolerance of procedure:  Tolerated well, no immediate complications        Differential Diagnosis includes:  Wound dehiscence  Good wound healing     Plan:  12 sutures removed here today    continue to keep it covered when partaking in outdoor activities    If you notice any drainage, redness, warmth, increased pain to the area, temperature >101.5 those are all signs of infection and you should seek medical evaluation         Final Diagnosis  Final diagnoses:   [Z48.02] Encounter for removal of sutures (Primary)         Jacques Earthly, PA        Author:  Jacques Earthly, PA           Jacques Earthly, [place]  11/11/19 1723

## 2019-11-23 ENCOUNTER — Ambulatory Visit
Admission: AD | Admit: 2019-11-23 | Discharge: 2019-11-23 | Disposition: A | Discharge status: Home / Self Care | Payer: No Typology Code available for payment source | Source: Ambulatory Visit | Attending: Emergency Medicine | Admitting: Emergency Medicine

## 2019-11-23 DIAGNOSIS — Z4800 Encounter for change or removal of nonsurgical wound dressing: Secondary | ICD-10-CM | POA: Insufficient documentation

## 2019-11-23 DIAGNOSIS — Z5189 Encounter for other specified aftercare: Secondary | ICD-10-CM

## 2019-11-23 NOTE — UC Provider Note (Signed)
History     Chief Complaint   Patient presents with    Wound Check     Pt comes to uc for wound check  -pt had a laceration to his right arm -- sutured 11/02/2019 - sutures removed on 11/11/2019  -   - had dehiscence of the wound and checked at [facility] emergency department on 11/14/2019          Medical/Surgical/Family History     Past Medical History:   Diagnosis Date    Anxiety     GERD (gastroesophageal reflux disease)     Herniated cervical disc     Kidney stone         There is no problem list on file for this patient.           Past Surgical History:   Procedure Laterality Date    APPENDECTOMY      Left knee arthroscopy      PR CYSTO/URETERO/PYELOSCOPY, DX Left 04/22/2018    Procedure: URETEROSCOPY WITH HOLMIUM LASER;  Surgeon: Ancil Boozer, MD;  Location: [facility];  Service: Urology    PR CYSTOSCOPY,INSERT URETHRAL STENT Left 04/22/2018    Procedure: CYSTOSCOPY URETERAL STENT;  Surgeon: Ancil Boozer, MD;  Location: [facility];  Service: Urology     Family History   Problem Relation Age of Onset    GERD Father           Social History     Tobacco Use    Smoking status: Current Every Day Smoker     Packs/day: 0.50     Years: 15.00     Pack years: 7.50     Types: Cigarettes    Smokeless tobacco: Never Used    Tobacco comment: 1\3 pack   Substance Use Topics    Alcohol use: Yes     Alcohol/week: 1.0 standard drinks     Types: 1 Shots of liquor per week     Comment: weekly     Drug use: No     Living Situation     Questions Responses    Patient lives with Family    Homeless No    Caregiver for other family member No    External Services None    Employment Employed    Domestic Violence Risk No                Review of Systems   Review of Systems   Skin: Positive for wound (right forearm).       Physical Exam   Triage Vitals  Triage Start: Start, (11/23/19 0825)   First Recorded BP: (!) 132/36, Resp: 14, Temp: 36.9 C (98.5 F), Temp src: TEMPORAL Oxygen Therapy SpO2: 97  %, Oximetry Source: Rt Hand, O2 Device: None (Room air), Heart Rate: 107, (11/23/19 0829)  .  First Pain Reported  0-10 Scale: 1, (11/23/19 9937)       Physical Exam  Vitals signs and nursing note reviewed.   Constitutional:       Appearance: Normal appearance.   Musculoskeletal: Normal range of motion.   Skin:     General: Skin is warm and dry.      Capillary Refill: Capillary refill takes less than 2 seconds.      Comments: Laceration wound right forearm - healing - dehiscence area healing by secondary intention - granulation tissue - no signs of infection --- normal range of motion - good distal pulses and cap refill - sensory intact  Neurological:      General: No focal deficit present.      Mental Status: He is alert.      Sensory: No sensory deficit.          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

## 2019-11-23 NOTE — Discharge Instructions (Signed)
Polysporin dressing - change each day - watch for infection - tylenol for pain - follow up - call [facility] - located at the [place] urgent care building on [address] near the [place]  - call [phone] for appointment in the next few days and return to work instructions

## 2019-11-23 NOTE — ED Triage Notes (Signed)
Pt is here to having his wound check. Pt wound is located on the right lower forearm. Pt was here 2 weeks ago to have stures removed from the area, and a few days a later the wound open.  Pt has been using xerform per [facility]. Pt does not have any more and was informed that he needs a Rx.        Triage Note   Mayer Camel, LPN

## 2020-01-02 ENCOUNTER — Ambulatory Visit
Admission: AD | Admit: 2020-01-02 | Discharge: 2020-01-02 | Disposition: A | Discharge status: Home / Self Care | Payer: Medicaid Other | Source: Ambulatory Visit | Attending: Family | Admitting: Family

## 2020-01-02 DIAGNOSIS — F17210 Nicotine dependence, cigarettes, uncomplicated: Secondary | ICD-10-CM

## 2020-01-02 DIAGNOSIS — J029 Acute pharyngitis, unspecified: Secondary | ICD-10-CM

## 2020-01-02 DIAGNOSIS — Z7189 Other specified counseling: Secondary | ICD-10-CM

## 2020-01-02 DIAGNOSIS — Z20822 Contact with and (suspected) exposure to covid-19: Secondary | ICD-10-CM | POA: Insufficient documentation

## 2020-01-02 NOTE — UC Provider Note (Signed)
History     Chief Complaint   Patient presents with    Sore Throat     This is a 34yo male with a PMH of anxiety and GERD presenting to urgent care with concerns for a sore throat since this morning. Patient states "its more sore in the nasal passage." He reports that a roommate that he shares common space with tested positive for covid-19 yesterday. Him and roommate were both exposed to roommate's girlfriend who was coughing in their apartment 4-days ago.  He has not been contacted by the health department yet. Patient denies having congestion, cough, chest pains, shortness of breath, fevers or chills. He denies having loss of taste or smell.       History provided by:  Patient  Language interpreter used: No        Medical/Surgical/Family History     Past Medical History:   Diagnosis Date    Anxiety     GERD (gastroesophageal reflux disease)     Herniated cervical disc     Kidney stone         There is no problem list on file for this patient.           Past Surgical History:   Procedure Laterality Date    APPENDECTOMY      Left knee arthroscopy      PR CYSTO/URETERO/PYELOSCOPY, DX Left 04/22/2018    Procedure: URETEROSCOPY WITH HOLMIUM LASER;  Surgeon: Ancil Boozer, MD;  Location: [facility];  Service: Urology    PR CYSTOSCOPY,INSERT URETHRAL STENT Left 04/22/2018    Procedure: CYSTOSCOPY URETERAL STENT;  Surgeon: Ancil Boozer, MD;  Location: [facility];  Service: Urology     Family History   Problem Relation Age of Onset    GERD Father           Social History     Tobacco Use    Smoking status: Current Every Day Smoker     Packs/day: 0.50     Years: 15.00     Pack years: 7.50     Types: Cigarettes    Smokeless tobacco: Never Used    Tobacco comment: 1\3 pack   Substance Use Topics    Alcohol use: Yes     Alcohol/week: 1.0 standard drinks     Types: 1 Shots of liquor per week     Comment: weekly     Drug use: No     Living Situation     Questions Responses    Patient lives with  Family    Homeless No    Caregiver for other family member No    External Services None    Employment Employed    Domestic Violence Risk No                Review of Systems   Review of Systems   Constitutional: Negative for fever.   HENT: Positive for sore throat. Negative for congestion and trouble swallowing.    Respiratory: Negative for shortness of breath.    Cardiovascular: Negative for chest pain.   Gastrointestinal: Negative for diarrhea, nausea and vomiting.   Musculoskeletal: Negative for myalgias.   Neurological: Negative for headaches.       Physical Exam   Triage Vitals  Triage Start: Start, (01/02/20 1311)   First Recorded BP: 129/77, Resp: 14, Temp: 36.4 C (97.6 F), Temp src: TEMPORAL Oxygen Therapy SpO2: 98 %, Oximetry Source: Rt Hand, O2 Device: None (Room air), Heart Rate:  89, (01/02/20 1324)  .      Physical Exam  Vitals and nursing note reviewed.   Constitutional:       Appearance: He is well-developed.   HENT:      Head: Atraumatic.      Nose: No congestion.      Mouth/Throat:      Mouth: Mucous membranes are moist.      Pharynx: Oropharynx is clear. Uvula midline. No posterior oropharyngeal erythema.   Cardiovascular:      Rate and Rhythm: Normal rate and regular rhythm.   Pulmonary:      Effort: Pulmonary effort is normal.      Breath sounds: Normal breath sounds.   Skin:     General: Skin is warm and dry.   Neurological:      Mental Status: He is alert.   Psychiatric:         Behavior: Behavior normal.          Medical Decision Making      Labs Reviewed   COVID-19 PCR       Initial Evaluation:  ED First Provider Contact     Date/Time Event User Comments    01/02/20 1312 ED First Provider Contact Devanta Daniel, [facility] Initial Face to Face Provider Contact          Patient was seen on: 01/02/2020        Assessment:  33 y.o.male comes to the Urgent [place] with one day of sore throat s/p exposure at home    Differential Diagnosis includes:    Viral Pharyngitis  Strep Pharyngitis  Post Nasal  Drip  Acute URI  Nasopharyngitis   Tonsillitis  Mono  Thrush      Plan:     Testing for COVID-19 completed today.    We will call if it is positive or negative. Patient advised to quarantine until results come back.     If test is positive, the health department will also be in contact. For now, patient will be treated conservatively with symptomatic relief for a likely viral illness.  I recommended resting, increased hydration, Motrin or Tylenol every 6 hours as needed for aches and fever, and a humidifier at the bedside at night.  For sore throat I recommended warm salt water gargles, Chloraseptic spray, or sore throat lozenges. I recommended follow-up with PCP if patient develops worsening sore throat, fevers, shortness of breath or difficulties breathing.    If symptoms are worsening such as shortness of breath, chest pain, fevers that are not reducing with tylenol / motrin, seek further immediate care in the ER            Final Diagnosis  Final diagnoses:   [Z71.89] Advice given about COVID-19 virus infection (Primary)         Seward Speck, NP              Seward Speck, NP  01/02/20 1359

## 2020-01-02 NOTE — Discharge Instructions (Addendum)
Testing for COVID-19 will be completed. We will call you if it is positive or negative. You need to quarantine until cleared by the health department, given exposure.    If you test positive, the health department will contact you. You should remain in isolation until your symptoms are improving and you have not had a fever for at least 72 hours (without the use of fever reducing medicines such as ibuprofen and Tylenol).       You should remain isolated from others in your home, cover any coughs/sneezes, do not share common household items (such as cups and silverware) and wash hands frequently.    You need to Continue symptomatic care otherwise including     - you should take vitamin D and zinc daily as directed   - lozenges or chloraseptic spray for sore throat  - saline or flonase nasal spray for nasal congestion  - over the counter decongestant. Mucinex for chest congestion (drink lots of water with this medication)  - tylenol or ibuprofen as needed  - bland diet, smaller meals, increased fluids    If your symptoms are worsening such as shortness of breath, chest pain, fevers that are not reducing with tylenol / motrin, seek further immediate care in the ER

## 2020-01-02 NOTE — ED Triage Notes (Addendum)
Pt c/o sore throat that started this morning, states his roommate tested positive for COVID last night.  Has not taken anything OTC for symptoms.         Triage Note   Purcell Nails, LPN

## 2020-01-03 ENCOUNTER — Telehealth: Payer: Self-pay

## 2020-01-03 LAB — COVID-19 NAAT (PCR): COVID-19 NAAT (PCR): NEGATIVE

## 2020-01-03 LAB — COVID-19 PCR

## 2020-01-03 NOTE — Telephone Encounter (Signed)
-----   Message from Rickard Patience, NP sent at 01/03/2020  4:15 PM EDT -----  Please inform patient Covid test is negative, follow up with PCP if symptoms have not improved.

## 2020-01-03 NOTE — Telephone Encounter (Signed)
Spoke with pt and informed them of negative result. Informed pt that if symptoms worsen or persist to contact PCP and continue to quarantine.

## 2020-11-22 ENCOUNTER — Emergency Department
Admission: EM | Admit: 2020-11-22 | Discharge: 2020-11-22 | Disposition: A | Discharge status: Home / Self Care | Payer: Managed Care, Other (non HMO) | Attending: Emergency Medicine | Admitting: Emergency Medicine

## 2020-11-22 ENCOUNTER — Encounter: Payer: Self-pay | Admitting: Emergency Medicine

## 2020-11-22 ENCOUNTER — Emergency Department: Payer: Managed Care, Other (non HMO)

## 2020-11-22 ENCOUNTER — Other Ambulatory Visit: Payer: Self-pay

## 2020-11-22 DIAGNOSIS — F17200 Nicotine dependence, unspecified, uncomplicated: Secondary | ICD-10-CM | POA: Diagnosis not present

## 2020-11-22 DIAGNOSIS — U071 COVID-19: Secondary | ICD-10-CM | POA: Diagnosis not present

## 2020-11-22 DIAGNOSIS — R509 Fever, unspecified: Secondary | ICD-10-CM | POA: Diagnosis present

## 2020-11-22 HISTORY — DX: Calculus of kidney: N20.0

## 2020-11-22 LAB — CBC WITH DIFFERENTIAL/PLATELET
Abs Immature Granulocytes: 0.05 10*3/uL (ref 0.00–0.07)
Basophils Absolute: 0 10*3/uL (ref 0.0–0.1)
Basophils Relative: 1 %
Eosinophils Absolute: 0.1 10*3/uL (ref 0.0–0.5)
Eosinophils Relative: 1 %
HCT: 44.4 % (ref 39.0–52.0)
Hemoglobin: 15.9 g/dL (ref 13.0–17.0)
Immature Granulocytes: 1 %
Lymphocytes Relative: 8 %
Lymphs Abs: 0.7 10*3/uL (ref 0.7–4.0)
MCH: 30.2 pg (ref 26.0–34.0)
MCHC: 35.8 g/dL (ref 30.0–36.0)
MCV: 84.4 fL (ref 80.0–100.0)
Monocytes Absolute: 0.8 10*3/uL (ref 0.1–1.0)
Monocytes Relative: 9 %
Neutro Abs: 6.9 10*3/uL (ref 1.7–7.7)
Neutrophils Relative %: 80 %
Platelets: 229 10*3/uL (ref 150–400)
RBC: 5.26 MIL/uL (ref 4.22–5.81)
RDW: 13.1 % (ref 11.5–15.5)
WBC: 8.5 10*3/uL (ref 4.0–10.5)
nRBC: 0 % (ref 0.0–0.2)

## 2020-11-22 LAB — SARS CORONAVIRUS 2 BY RT PCR (HOSPITAL ORDER, PERFORMED IN ~~LOC~~ HOSPITAL LAB): SARS Coronavirus 2: POSITIVE — AB

## 2020-11-22 LAB — COMPREHENSIVE METABOLIC PANEL
ALT: 75 U/L — ABNORMAL HIGH (ref 0–44)
AST: 49 U/L — ABNORMAL HIGH (ref 15–41)
Albumin: 4.6 g/dL (ref 3.5–5.0)
Alkaline Phosphatase: 85 U/L (ref 38–126)
Anion gap: 12 (ref 5–15)
BUN: 12 mg/dL (ref 6–20)
CO2: 24 mmol/L (ref 22–32)
Calcium: 9.3 mg/dL (ref 8.9–10.3)
Chloride: 101 mmol/L (ref 98–111)
Creatinine, Ser: 0.96 mg/dL (ref 0.61–1.24)
GFR, Estimated: >60 mL/min (ref >60)
Glucose, Bld: 109 mg/dL — ABNORMAL HIGH (ref 70–99)
Potassium: 3.9 mmol/L (ref 3.5–5.1)
Sodium: 137 mmol/L (ref 135–145)
Total Bilirubin: 0.5 mg/dL (ref 0.3–1.2)
Total Protein: 7.6 g/dL (ref 6.5–8.1)

## 2020-11-22 LAB — POC SARS CORONAVIRUS 2 AG -  ED: SARS Coronavirus 2 Ag: NEGATIVE

## 2020-11-22 LAB — TROPONIN I (HIGH SENSITIVITY): Troponin I (High Sensitivity): 3 ng/L (ref <18)

## 2020-11-22 MED ORDER — KETOROLAC TROMETHAMINE 30 MG/ML IJ SOLN
30.0000 mg | Freq: Once | INTRAMUSCULAR | Status: AC
  Administered 2020-11-22: 30 mg via INTRAVENOUS
  Filled 2020-11-22: qty 1

## 2020-11-22 MED ORDER — SODIUM CHLORIDE 0.9 % IV BOLUS
500.0000 mL | Freq: Once | INTRAVENOUS | Status: AC
  Administered 2020-11-22: 500 mL via INTRAVENOUS

## 2020-11-22 MED ORDER — ACETAMINOPHEN 325 MG PO TABS: 650.0000 mg | ORAL_TABLET | Freq: Once | ORAL | Status: DC

## 2020-11-22 NOTE — ED Triage Notes (Addendum)
Patient ambulatory to triage with steady gait, without difficulty or distress noted; pt reports fever 102 accomp by chills, SHOB, body aches since last night; denies cough or congestion

## 2020-11-22 NOTE — ED Notes (Signed)
See triage note  Presents with body aches and fever since yesterday  Low grade temp noted on arrival

## 2020-11-22 NOTE — ED Provider Notes (Signed)
[facility] Emergency Department Provider Note   ____________________________________________    I have reviewed the triage vital signs and the nursing notes.   HISTORY  Chief Complaint Fever     HPI Darren Rowland is a 35 y.o. male with history of kidney stones presents with complaints of fever, body aches, headache and shortness of breath which is now resolved.  Patient reports he received his [employer] vaccine on January 14.  He does not take anything for this.  He reports occasionally but he does feel short of breath in the morning, he does smoke cigarettes.  No sick contacts reported but he does work in Engineering geologist  Past Medical History:  Diagnosis Date  . Kidney stone     There are no problems to display for this patient.   Past Surgical History:  Procedure Laterality Date  . APPENDECTOMY    . KNEE SURGERY Left     Prior to Admission medications   Not on File     Allergies Morphine and related  No family history on file.  Social History Social History   Tobacco Use  . Smoking status: Current Every Day Smoker  . Smokeless tobacco: Never Used  Vaping Use  . Vaping Use: Never used    Review of Systems  Constitutional: Positive fever Eyes: No visual changes.  ENT: No sore throat. Cardiovascular: Denies chest pain. Respiratory: As above. Gastrointestinal: No abdominal pain.  No nausea, no vomiting.   Genitourinary: Negative for dysuria. Musculoskeletal: Positive myalgias Skin: Negative for rash. Neurological: Positive headache   ____________________________________________   PHYSICAL EXAM:  VITAL SIGNS: ED Triage Vitals  Enc Vitals Group     BP 11/22/20 0548 137/75     Pulse Rate 11/22/20 0548 (!) 142     Resp 11/22/20 0548 20     Temp 11/22/20 0548 100.3 F (37.9 C)     Temp Source 11/22/20 0548 Oral     SpO2 --      Weight 11/22/20 0549 111.1 kg (245 lb)     Height 11/22/20 0549 1.803 m (5\' 11" )      Head Circumference --      Peak Flow --      Pain Score 11/22/20 0549 0     Pain Loc --      Pain Edu? --      Excl. in GC? --     Constitutional: Alert and oriented.  Eyes: Conjunctivae are normal.   Nose: No congestion/rhinnorhea. Mouth/Throat: Mucous membranes are moist.    Cardiovascular: Tachycardia, regular rhythm. Grossly normal heart sounds.  Good peripheral circulation. Respiratory: Normal respiratory effort.  No retractions. Lungs CTAB. Gastrointestinal: Soft and nontender. No distention.    Musculoskeletal: No lower extremity tenderness nor edema.  Warm and well perfused Neurologic:  Normal speech and language. No gross focal neurologic deficits are appreciated.  Skin:  Skin is warm, dry and intact. No rash noted. Psychiatric: Mood and affect are normal. Speech and behavior are normal.  ____________________________________________   LABS (all labs ordered are listed, but only abnormal results are displayed)  Labs Reviewed  COMPREHENSIVE METABOLIC PANEL - Abnormal; Notable for the following components:      Result Value   Glucose, Bld 109 (*)    AST 49 (*)    ALT 75 (*)    All other components within normal limits  SARS CORONAVIRUS 2 BY RT PCR (HOSPITAL ORDER, PERFORMED IN [facility])  CBC WITH DIFFERENTIAL/PLATELET  POC SARS CORONAVIRUS 2 AG -  ED  TROPONIN I (HIGH SENSITIVITY)   ____________________________________________  EKG  ED ECG REPORT I, Jene Every, the attending physician, personally viewed and interpreted this ECG.  Date: 11/22/2020  Rhythm: Sinus tachycardia QRS Axis: normal Intervals: normal ST/T Wave abnormalities: normal Narrative Interpretation: no evidence of acute ischemia  ____________________________________________  RADIOLOGY  Chest x-ray viewed by me, no infiltrate or effusion ____________________________________________   PROCEDURES  Procedure(s) performed: No  Procedures   Critical Care  performed: No ____________________________________________   INITIAL IMPRESSION / ASSESSMENT AND PLAN / ED COURSE  Pertinent labs & imaging results that were available during my care of the patient were reviewed by me and considered in my medical decision making (see chart for details).  Patient presents with fevers, body aches, chills, headache, episode of shortness of breath.  Highly suspicious for COVID-19 given current outbreak.  Rapid swab ordered.  Will give IV fluids given tachycardia.  No pleurisy or chest pain.  IV Toradol ordered   Patient's heart rate has improved significantly, rapid test negative, still strongly suspect COVID-19, PCR sent.  He was feeling improved, appropriate for discharge at this time    ____________________________________________   FINAL CLINICAL IMPRESSION(S) / ED DIAGNOSES  Final diagnoses:  Clinical diagnosis of COVID-19        Note:  This document was prepared using Dragon voice recognition software and may include unintentional dictation errors.   Jene Every, MD 11/22/20 [phone]

## 2020-11-23 ENCOUNTER — Telehealth: Payer: Self-pay | Admitting: *Deleted

## 2020-11-23 NOTE — Telephone Encounter (Signed)
.  Called to discuss with patient about COVID-19 symptoms and the use of one of the available treatments for those with mild to moderate Covid symptoms and at a high risk of hospitalization.  Pt appears to qualify for outpatient treatment due to co-morbid conditions and/or a member of an at-risk group in accordance with the FDA Emergency Use Authorization.    Symptom onset:  Vaccinated:  Booster?  Immunocompromised?  Qualifiers:   Patient politely declined.  Karsten Fells

## 2021-03-17 ENCOUNTER — Emergency Department (HOSPITAL_COMMUNITY): Payer: Managed Care, Other (non HMO)

## 2021-03-17 ENCOUNTER — Encounter (HOSPITAL_COMMUNITY): Payer: Self-pay

## 2021-03-17 ENCOUNTER — Other Ambulatory Visit: Payer: Self-pay

## 2021-03-17 ENCOUNTER — Emergency Department (HOSPITAL_COMMUNITY)
Admission: EM | Admit: 2021-03-17 | Discharge: 2021-03-17 | Disposition: A | Discharge status: Home / Self Care | Payer: Managed Care, Other (non HMO) | Attending: Emergency Medicine | Admitting: Emergency Medicine

## 2021-03-17 DIAGNOSIS — F17210 Nicotine dependence, cigarettes, uncomplicated: Secondary | ICD-10-CM | POA: Diagnosis not present

## 2021-03-17 DIAGNOSIS — H9313 Tinnitus, bilateral: Secondary | ICD-10-CM | POA: Insufficient documentation

## 2021-03-17 DIAGNOSIS — E860 Dehydration: Secondary | ICD-10-CM | POA: Insufficient documentation

## 2021-03-17 DIAGNOSIS — R42 Dizziness and giddiness: Secondary | ICD-10-CM | POA: Diagnosis present

## 2021-03-17 LAB — CBC WITH DIFFERENTIAL/PLATELET
Abs Immature Granulocytes: 0.02 10*3/uL (ref 0.00–0.07)
Basophils Absolute: 0 10*3/uL (ref 0.0–0.1)
Basophils Relative: 0 %
Eosinophils Absolute: 0.2 10*3/uL (ref 0.0–0.5)
Eosinophils Relative: 2 %
HCT: 45.4 % (ref 39.0–52.0)
Hemoglobin: 15.5 g/dL (ref 13.0–17.0)
Immature Granulocytes: 0 %
Lymphocytes Relative: 35 %
Lymphs Abs: 2.7 10*3/uL (ref 0.7–4.0)
MCH: 29.7 pg (ref 26.0–34.0)
MCHC: 34.1 g/dL (ref 30.0–36.0)
MCV: 87 fL (ref 80.0–100.0)
Monocytes Absolute: 0.5 10*3/uL (ref 0.1–1.0)
Monocytes Relative: 7 %
Neutro Abs: 4.2 10*3/uL (ref 1.7–7.7)
Neutrophils Relative %: 56 %
Platelets: 256 10*3/uL (ref 150–400)
RBC: 5.22 MIL/uL (ref 4.22–5.81)
RDW: 13 % (ref 11.5–15.5)
WBC: 7.7 10*3/uL (ref 4.0–10.5)
nRBC: 0 % (ref 0.0–0.2)

## 2021-03-17 LAB — URINALYSIS, ROUTINE W REFLEX MICROSCOPIC
Bacteria, UA: NONE SEEN
Bilirubin Urine: NEGATIVE
Glucose, UA: NEGATIVE mg/dL
Ketones, ur: NEGATIVE mg/dL
Leukocytes,Ua: NEGATIVE
Nitrite: NEGATIVE
Protein, ur: NEGATIVE mg/dL
Specific Gravity, Urine: 1.018 (ref 1.005–1.030)
pH: 6 (ref 5.0–8.0)

## 2021-03-17 LAB — CBG MONITORING, ED: Glucose-Capillary: 105 mg/dL — ABNORMAL HIGH (ref 70–99)

## 2021-03-17 LAB — COMPREHENSIVE METABOLIC PANEL
ALT: 36 U/L (ref 0–44)
AST: 22 U/L (ref 15–41)
Albumin: 4.3 g/dL (ref 3.5–5.0)
Alkaline Phosphatase: 77 U/L (ref 38–126)
Anion gap: 6 (ref 5–15)
BUN: 10 mg/dL (ref 6–20)
CO2: 27 mmol/L (ref 22–32)
Calcium: 9.4 mg/dL (ref 8.9–10.3)
Chloride: 106 mmol/L (ref 98–111)
Creatinine, Ser: 0.86 mg/dL (ref 0.61–1.24)
GFR, Estimated: >60 mL/min (ref >60)
Glucose, Bld: 94 mg/dL (ref 70–99)
Potassium: 3.7 mmol/L (ref 3.5–5.1)
Sodium: 139 mmol/L (ref 135–145)
Total Bilirubin: 0.4 mg/dL (ref 0.3–1.2)
Total Protein: 7.5 g/dL (ref 6.5–8.1)

## 2021-03-17 LAB — LIPASE, BLOOD: Lipase: 30 U/L (ref 11–51)

## 2021-03-17 MED ORDER — ONDANSETRON HCL 4 MG/2ML IJ SOLN
4.0000 mg | Freq: Once | INTRAMUSCULAR | Status: AC
  Administered 2021-03-17: 4 mg via INTRAVENOUS
  Filled 2021-03-17: qty 2

## 2021-03-17 MED ORDER — ONDANSETRON 4 MG PO TBDP: 4.0000 mg | ORAL_TABLET | Freq: Three times a day (TID) | ORAL | 0 refills | Status: DC | PRN

## 2021-03-17 MED ORDER — LACTATED RINGERS IV BOLUS
1000.0000 mL | Freq: Once | INTRAVENOUS | Status: AC
  Administered 2021-03-17: 1000 mL via INTRAVENOUS

## 2021-03-17 NOTE — ED Triage Notes (Signed)
Patient reports extreme weakness today. Patient had to be assisted out of the car today when he drove to the ED.  Patient states diarrhea x 2 days. Patient also c/o ringing in both ears L>R. Patient was p[rescribed Meclizine at the [facility]  Yesterday and states they told him it was losing his balance and nausea.

## 2021-03-17 NOTE — ED Provider Notes (Signed)
[facility]   CSN: [id number] Arrival date & time: 03/17/21  1648     History Chief Complaint  Patient presents with  . Weakness    Darren Rowland is a 35 y.o. male.  Patient is a 35 year old male with a history of tobacco use but no other acute medical problems who is presenting today with multiple vague complaints.  Patient symptoms started approximately 2 days ago when he describes a sensation of dizziness, difficulty walking, pressure behind his eyes, significant nausea and then diarrhea starting today.  He has eaten very little because he has worsening nausea when he attempts to eat.  Tinnitis bilaterally but worse on the left.  NO ASA or allergies. The dizziness is not all the time it occurs intermittently and is usually initiated by movement.  Sometimes when he is walking he feels like he has no depth perception and sometimes with sitting up will feel pressure behind both eyes.  His vision has not changed, speech is normal, no unilateral numbness or weakness.  He denies any neck pain or fever.  No significant nasal congestion, cough or sore throat.  He is having some left-sided upper abdominal pain but nothing makes it worse.  Denies alcohol or drug use.  The history is provided by the patient.  Weakness      Past Medical History:  Diagnosis Date  . Kidney stone     There are no problems to display for this patient.   Past Surgical History:  Procedure Laterality Date  . APPENDECTOMY    . KNEE SURGERY Left        Family History  Family history unknown: Yes    Social History   Tobacco Use  . Smoking status: Current Every Day Smoker    Packs/day: 0.35    Types: Cigarettes  . Smokeless tobacco: Never Used  Vaping Use  . Vaping Use: Never used  Substance Use Topics  . Alcohol use: Never  . Drug use: Never    Home Medications Prior to Admission medications   Not on File    Allergies    Morphine and  related  Review of Systems   Review of Systems  Neurological: Positive for weakness.  All other systems reviewed and are negative.   Physical Exam Updated Vital Signs BP (!) 141/99 (BP Location: Left Arm)   Pulse 79   Temp 98.6 F (37 C) (Oral)   Resp 18   Ht 5\' 11"  (1.803 m)   Wt 124.7 kg   SpO2 100%   BMI 38.35 kg/m   Physical Exam Vitals and nursing note reviewed.  Constitutional:      General: He is not in acute distress.    Appearance: He is well-developed.  HENT:     Head: Normocephalic and atraumatic.     Right Ear: A middle ear effusion is present.     Left Ear: Tympanic membrane normal.     Mouth/Throat:     Mouth: Mucous membranes are moist.  Eyes:     General: No visual field deficit.    Conjunctiva/sclera: Conjunctivae normal.     Pupils: Pupils are equal, round, and reactive to light.  Cardiovascular:     Rate and Rhythm: Normal rate and regular rhythm.     Heart sounds: No murmur heard.   Pulmonary:     Effort: Pulmonary effort is normal. No respiratory distress.     Breath sounds: Normal breath sounds. No wheezing or rales.  Abdominal:  General: There is no distension.     Palpations: Abdomen is soft.     Tenderness: There is abdominal tenderness in the left upper quadrant. There is guarding. There is no rebound.  Musculoskeletal:        General: No tenderness. Normal range of motion.     Cervical back: Normal range of motion and neck supple.  Skin:    General: Skin is warm and dry.     Findings: No erythema or rash.  Neurological:     Mental Status: He is alert and oriented to person, place, and time.     Cranial Nerves: Cranial nerves are intact. No dysarthria or facial asymmetry.     Sensory: Sensation is intact.     Motor: Motor function is intact. No weakness or pronator drift.     Coordination: Romberg sign negative. Coordination normal. Heel to Shin Test normal.     Gait: Gait is intact.  Psychiatric:        Mood and Affect: Mood  normal.        Behavior: Behavior normal.        Thought Content: Thought content normal.      ED Results / Procedures / Treatments   Labs (all labs ordered are listed, but only abnormal results are displayed) Labs Reviewed  URINALYSIS, ROUTINE W REFLEX MICROSCOPIC - Abnormal; Notable for the following components:      Result Value   Hgb urine dipstick SMALL (*)    All other components within normal limits  CBG MONITORING, ED - Abnormal; Notable for the following components:   Glucose-Capillary 105 (*)    All other components within normal limits  COMPREHENSIVE METABOLIC PANEL  CBC WITH DIFFERENTIAL/PLATELET  LIPASE, BLOOD    EKG EKG Interpretation  Date/Time:  Friday Mar 17 2021 17:34:41 EDT Ventricular Rate:  77 PR Interval:  154 QRS Duration: 81 QT Interval:  361 QTC Calculation: 409 R Axis:   76 Text Interpretation: Sinus rhythm ST elev, probable normal early repol pattern 12 Lead; Mason-Likar No significant change since last tracing Confirmed by Gwyneth Sprout ([id number]) on 03/17/2021 7:55:19 PM   Radiology CT Head Wo Contrast  Result Date: 03/17/2021 CLINICAL DATA:  Vertigo, dizziness, weakness for 3 days EXAM: CT HEAD WITHOUT CONTRAST TECHNIQUE: Contiguous axial images were obtained from the base of the skull through the vertex without intravenous contrast. COMPARISON:  None. FINDINGS: Brain: No acute infarct or hemorrhage. Lateral ventricles and midline structures are unremarkable. No acute extra-axial fluid collections. No mass effect. Vascular: No hyperdense vessel or unexpected calcification. Skull: Normal. Negative for fracture or focal lesion. Sinuses/Orbits: No acute finding. Other: None. IMPRESSION: 1. No acute intracranial process. Electronically Signed   By: Sharlet Salina M.D.   On: 03/17/2021 20:42    Procedures Procedures   Medications Ordered in ED Medications  lactated ringers bolus 1,000 mL (has no administration in time range)  ondansetron  (ZOFRAN) injection 4 mg (has no administration in time range)    ED Course  I have reviewed the triage vital signs and the nursing notes.  Pertinent labs & imaging results that were available during my care of the patient were reviewed by me and considered in my medical decision making (see chart for details).    MDM Rules/Calculators/A&P                          Patient presenting today with symptoms of dizziness, intermittent difficulty walking, pressure behind his  eyes and nausea.  He had 2 episodes of diarrhea today as well.  On exam patient has no significant neurologic findings.  He has no pressure over his sinuses.  He had normal heel-to-shin and negative Romberg.  He was able to ambulate in the room without difficulty but did feel slightly dizzy when he sat up from lying down.  He has no symptoms to suggest meningitis.  Low suspicion for stroke.  However given his symptoms we will do head CT to rule out space-occupying lesion or increased intracranial pressure.  Patient was tested yesterday for COVID and was negative.  Labs today show normal CMP, CBC, lipase.  UA is pending.  Patient does have some left upper quadrant pain but no other abdominal pain and lipase was within normal limits.  He does not use NSAIDs, Goody powders or BC powders concerning for PUD.  No findings on exam concerning for appendicitis, diverticulitis, hepatitis or cholelithiasis.  10:56 PM Labs wnl.  CT neg for acute findings.  Will d/c home with return precautions and f/u if tinnitus does not improve.  MDM Number of Diagnoses or Management Options   Amount and/or Complexity of Data Reviewed Clinical lab tests: ordered and reviewed Tests in the radiology section of CPT: ordered and reviewed Independent visualization of images, tracings, or specimens: yes   Final Clinical Impression(s) / ED Diagnoses Final diagnoses:  Dehydration  Tinnitus of both ears    Rx / DC Orders ED Discharge Orders          Ordered    ondansetron (ZOFRAN ODT) 4 MG disintegrating tablet  Every 8 hours PRN        03/17/21 2302           Gwyneth Sprout, MD 03/17/21 2302

## 2021-03-17 NOTE — ED Provider Notes (Signed)
Emergency Medicine Provider Triage Evaluation Note  Darren Rowland , a 35 y.o. male  was evaluated in triage.  Pt complains of generalized weakness that started today. Further reports that a few days ago he was feeling off balance, he was given meclizine however this has not resolved his symptoms. Denies rhinorrhea, fevers, chills, chest pain, sob, nausea and vomiting.  Further reports diarrhea. Reports an occasional cough  Review of Systems  Positive: Generalized weakness, occasional cough, diarrhea Negative: rhinorrhea, fevers, chills, chest pain, sob, nausea and vomiting.  Physical Exam  BP (!) 136/92 (BP Location: Left Arm)   Pulse 82   Temp 98.1 F (36.7 C) (Oral)   Resp 16   Ht 5\' 11"  (1.803 m)   Wt 124.7 kg   SpO2 99%   BMI 38.35 kg/m  Gen:   Awake, no distress   Resp:  Normal effort  MSK:   Moves extremities without difficulty  Other:  Clear speech, mild luq ttp  Medical Decision Making  Medically screening exam initiated at 5:23 PM.  Appropriate orders placed.  was informed that the remainder of the evaluation will be completed by another provider, this initial triage assessment does not replace that evaluation, and the importance of remaining in the ED until their evaluation is complete.    Lavina Hamman, PA-C 03/17/21 1723    03/19/21, MD 03/17/21 [phone]

## 2021-06-03 ENCOUNTER — Emergency Department (HOSPITAL_COMMUNITY)
Admission: EM | Admit: 2021-06-03 | Discharge: 2021-06-03 | Disposition: A | Discharge status: Home / Self Care | Payer: Managed Care, Other (non HMO) | Attending: Emergency Medicine | Admitting: Emergency Medicine

## 2021-06-03 ENCOUNTER — Other Ambulatory Visit: Payer: Self-pay

## 2021-06-03 DIAGNOSIS — M542 Cervicalgia: Secondary | ICD-10-CM | POA: Diagnosis not present

## 2021-06-03 DIAGNOSIS — G43909 Migraine, unspecified, not intractable, without status migrainosus: Secondary | ICD-10-CM | POA: Diagnosis not present

## 2021-06-03 DIAGNOSIS — Z5321 Procedure and treatment not carried out due to patient leaving prior to being seen by health care provider: Secondary | ICD-10-CM | POA: Diagnosis not present

## 2021-06-03 MED ORDER — KETOROLAC TROMETHAMINE 15 MG/ML IJ SOLN: 15.0000 mg | Freq: Once | INTRAMUSCULAR | Status: DC

## 2021-06-03 MED ORDER — DIPHENHYDRAMINE HCL 50 MG/ML IJ SOLN: 12.5000 mg | Freq: Once | INTRAMUSCULAR | Status: DC

## 2021-06-03 MED ORDER — METOCLOPRAMIDE HCL 5 MG/ML IJ SOLN: 10.0000 mg | Freq: Once | INTRAMUSCULAR | Status: DC

## 2021-06-03 NOTE — ED Notes (Signed)
Registration informed said tech that pt went home. Moving OTF.

## 2021-06-03 NOTE — ED Provider Notes (Signed)
MSE was initiated and I personally evaluated the patient and placed orders (if any) at  1:18 AM on June 03, 2021.  Patient with headache typical for migraine history. He reports he has muscular tension/spasm problems in his neck that cause headache, that then instigates a full migraine. Usually take Flexeril for pain that wards off progression to worse headache. No fever.   No neuro deficits Alert, oriented  The patient appears stable so that the remainder of the MSE may be completed by another provider.   Elpidio Anis, PA-C 06/03/21 0120    Mesner, Barbara Cower, MD 06/03/21 [phone]

## 2021-06-03 NOTE — ED Triage Notes (Signed)
Pt reported to ED with c/o migraine that occurred upon awakening. Reports sensitivity to light, neck pain and nausea. States that he has taken all prescribed medications with no relief in symptoms.

## 2022-01-04 IMAGING — CT CT HEAD W/O CM
3 series · 15 of 47 positions shown, 18 images · non-contrast
Comparison: None.

CLINICAL DATA: Vertigo, dizziness, weakness for 3 days

EXAM:
CT HEAD WITHOUT CONTRAST
TECHNIQUE: Contiguous axial images were obtained from the base of the skull
through the vertex without intravenous contrast.

[Series 2: head wo · axial · 0.47mm/px · z∈[-149,-24]mm · 9 of 31 slices shown, 12 images]
[im 3/31  brain]
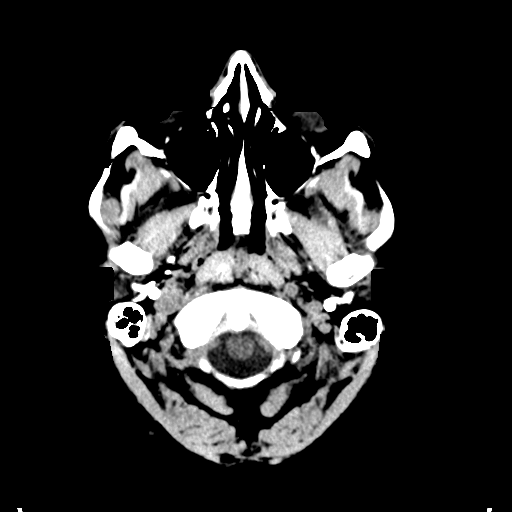
[im 3/31  bone]
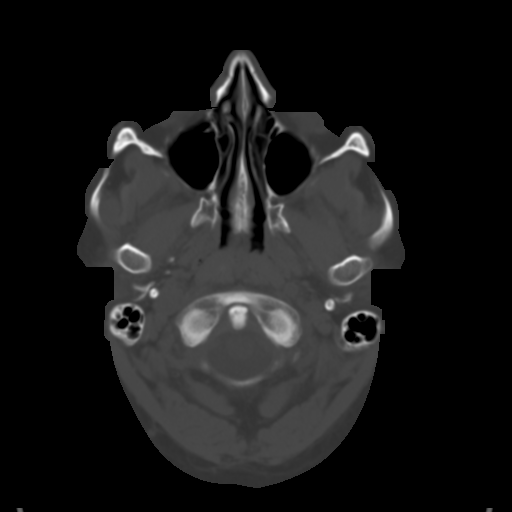
[im 6/31  brain]
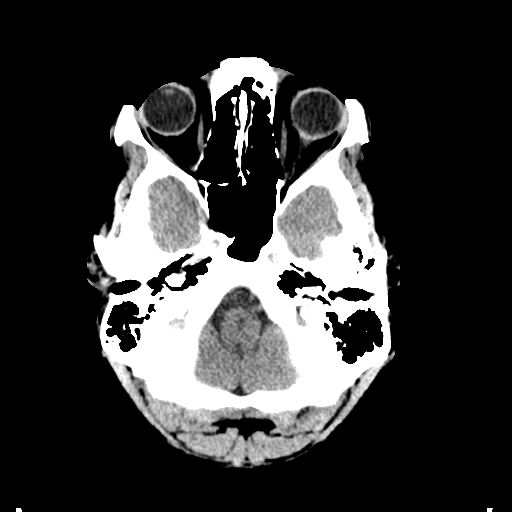
[im 9/31  brain]
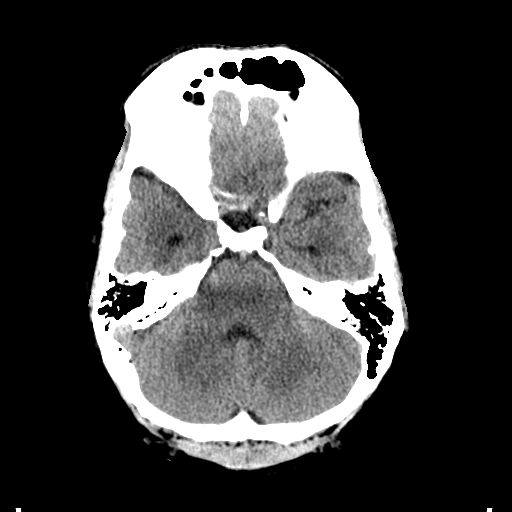
[im 12/31  brain]
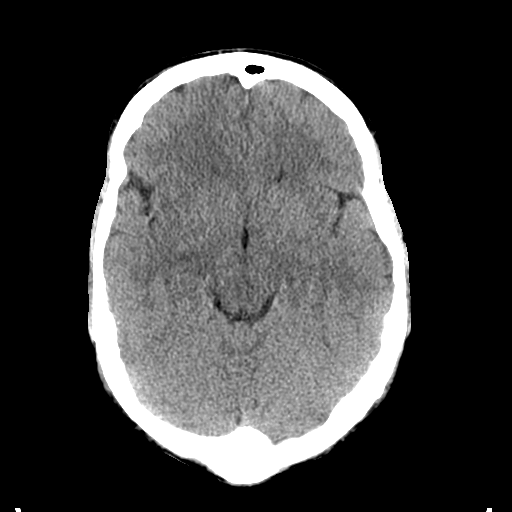
[im 16/31  brain]
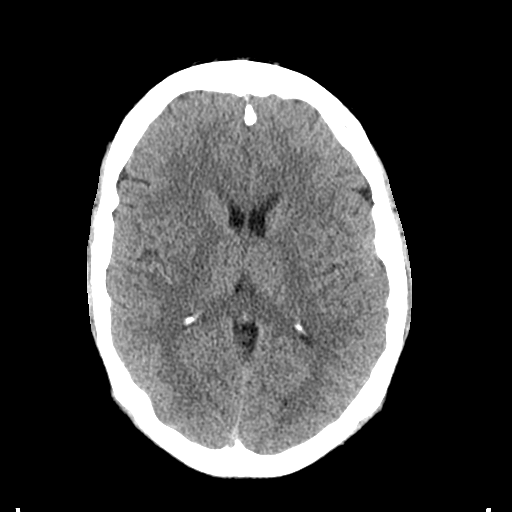
[im 16/31  bone]
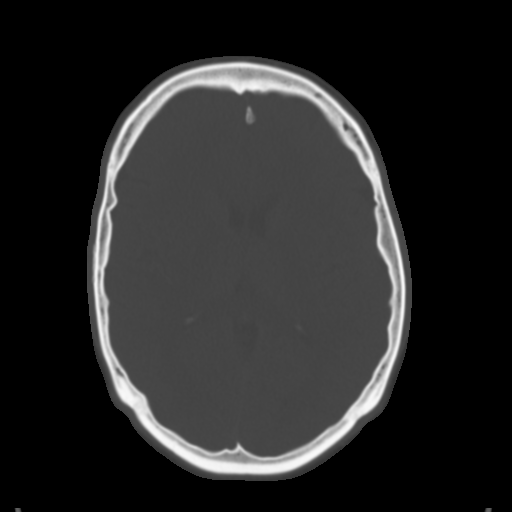
[im 19/31  brain]
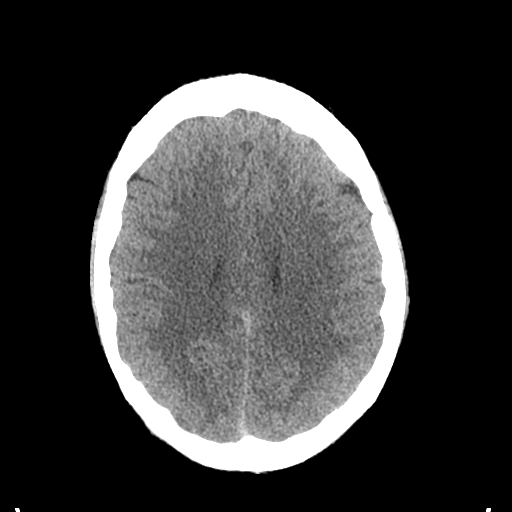
[im 22/31  brain]
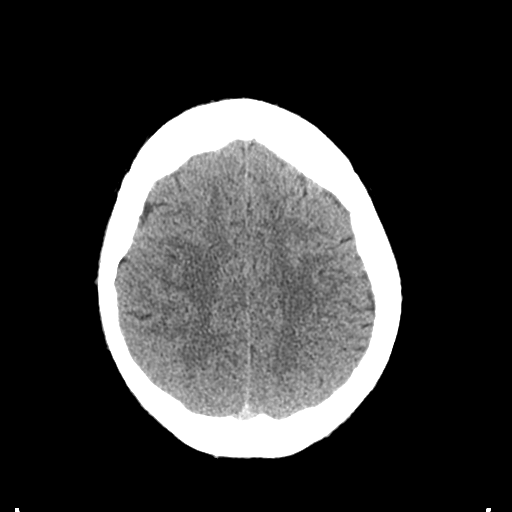
[im 25/31  brain]
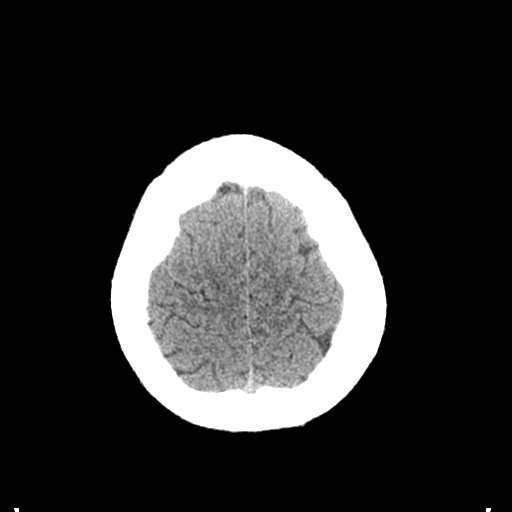
[im 28/31  brain]
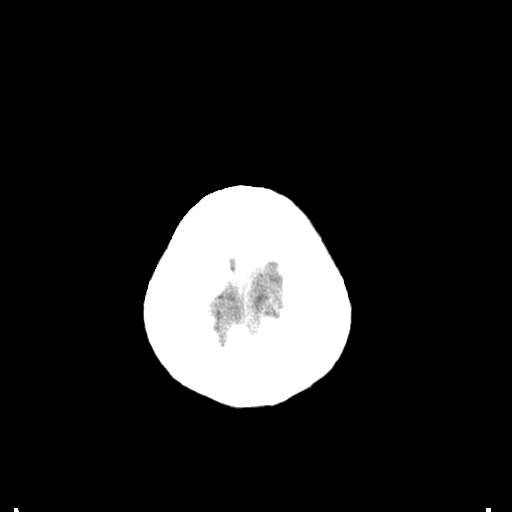
[im 28/31  bone]
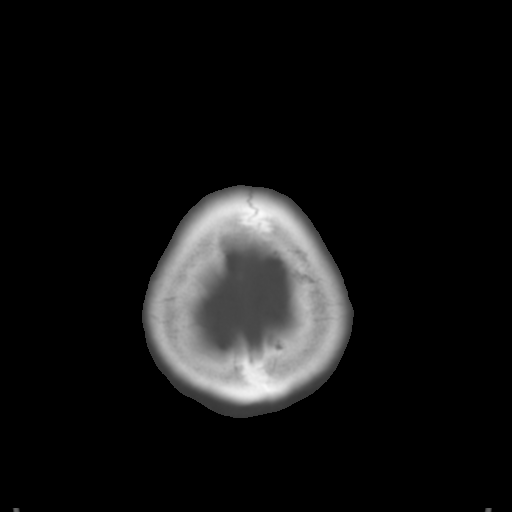

[Series 4: coronal soft tissue · coronal · 0.31mm/px · 3 of 76 slices shown]
[im 26/76  brain]
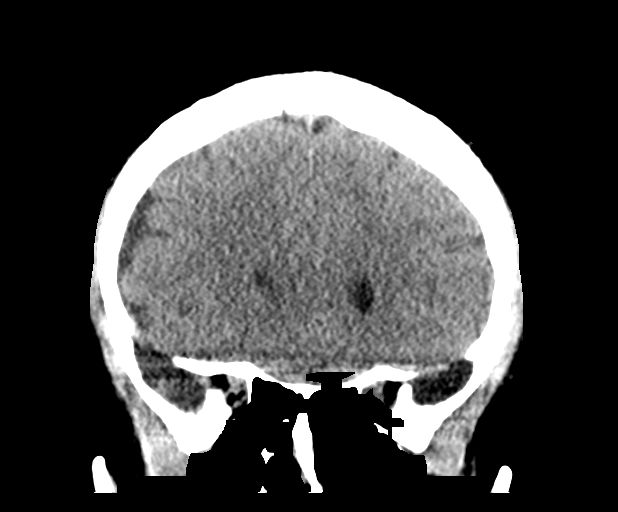
[im 34/76  brain]
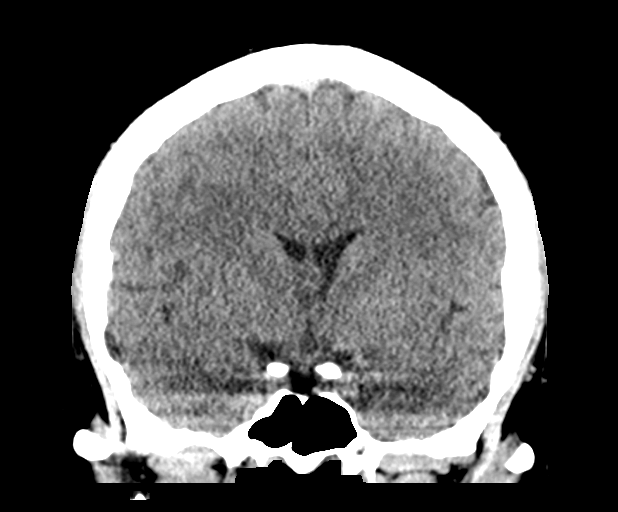
[im 42/76  brain]
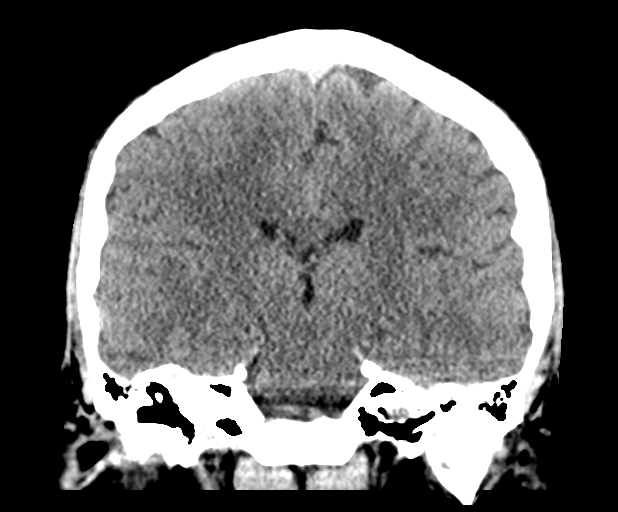

[Series 5: sagittal soft tissue · sagittal · 0.30mm/px · 3 of 65 slices shown]
[im 22/65  brain]
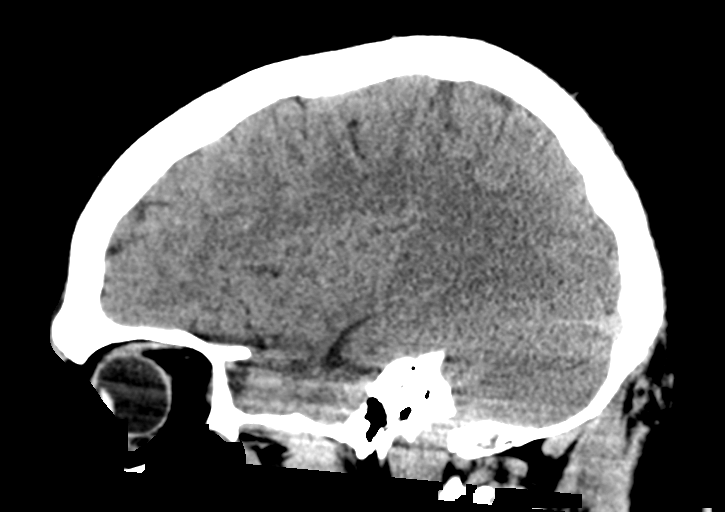
[im 33/65  brain]
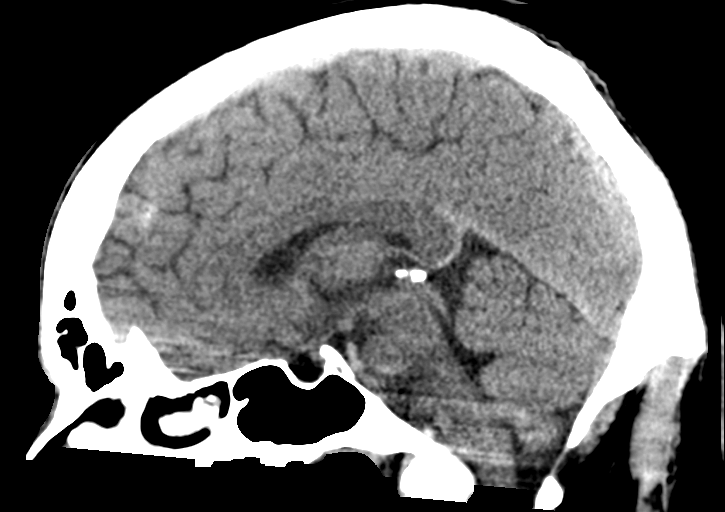
[im 43/65  brain]
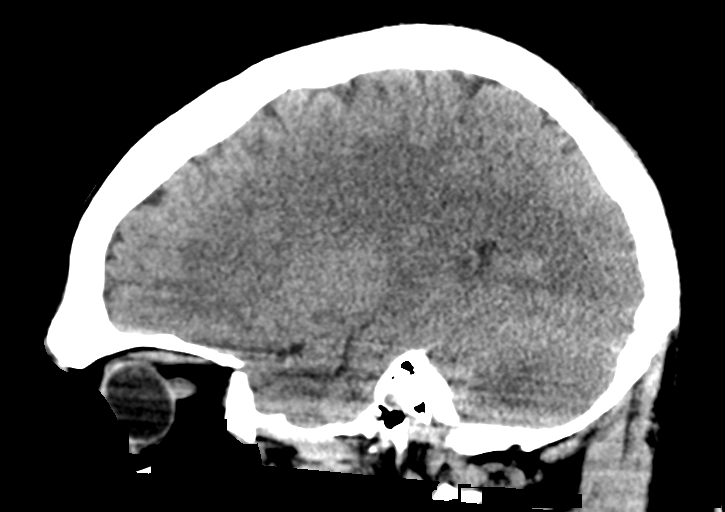

[15 of 47 positions shown; findings below may reference images not displayed]

FINDINGS: Brain: No acute infarct or hemorrhage. Lateral ventricles and
midline structures are unremarkable. No acute extra-axial fluid
collections. No mass effect.

Vascular: No hyperdense vessel or unexpected calcification.

Skull: Normal. Negative for fracture or focal lesion.

Sinuses/Orbits: No acute finding.

Other: None.
IMPRESSION: 1. No acute intracranial process.

## 2022-08-12 ENCOUNTER — Encounter (HOSPITAL_COMMUNITY): Payer: Self-pay | Admitting: Emergency Medicine

## 2022-08-12 ENCOUNTER — Emergency Department (HOSPITAL_COMMUNITY): Payer: Self-pay

## 2022-08-12 ENCOUNTER — Emergency Department (HOSPITAL_COMMUNITY)
Admission: EM | Admit: 2022-08-12 | Discharge: 2022-08-12 | Disposition: A | Discharge status: Home / Self Care | Payer: Self-pay | Attending: Emergency Medicine | Admitting: Emergency Medicine

## 2022-08-12 DIAGNOSIS — N132 Hydronephrosis with renal and ureteral calculous obstruction: Secondary | ICD-10-CM | POA: Insufficient documentation

## 2022-08-12 DIAGNOSIS — N200 Calculus of kidney: Secondary | ICD-10-CM

## 2022-08-12 LAB — CBC WITH DIFFERENTIAL/PLATELET
Abs Immature Granulocytes: 0.03 10*3/uL (ref 0.00–0.07)
Basophils Absolute: 0.1 10*3/uL (ref 0.0–0.1)
Basophils Relative: 1 %
Eosinophils Absolute: 0.2 10*3/uL (ref 0.0–0.5)
Eosinophils Relative: 2 %
HCT: 44.5 % (ref 39.0–52.0)
Hemoglobin: 15 g/dL (ref 13.0–17.0)
Immature Granulocytes: 0 %
Lymphocytes Relative: 34 %
Lymphs Abs: 3.3 10*3/uL (ref 0.7–4.0)
MCH: 29.6 pg (ref 26.0–34.0)
MCHC: 33.7 g/dL (ref 30.0–36.0)
MCV: 87.9 fL (ref 80.0–100.0)
Monocytes Absolute: 0.7 10*3/uL (ref 0.1–1.0)
Monocytes Relative: 7 %
Neutro Abs: 5.6 10*3/uL (ref 1.7–7.7)
Neutrophils Relative %: 56 %
Platelets: 311 10*3/uL (ref 150–400)
RBC: 5.06 MIL/uL (ref 4.22–5.81)
RDW: 13.2 % (ref 11.5–15.5)
WBC: 9.8 10*3/uL (ref 4.0–10.5)
nRBC: 0 % (ref 0.0–0.2)

## 2022-08-12 LAB — URINALYSIS, ROUTINE W REFLEX MICROSCOPIC
Bilirubin Urine: NEGATIVE
Glucose, UA: NEGATIVE mg/dL
Ketones, ur: NEGATIVE mg/dL
Leukocytes,Ua: NEGATIVE
Nitrite: NEGATIVE
Protein, ur: NEGATIVE mg/dL
Specific Gravity, Urine: 1.02 (ref 1.005–1.030)
pH: 6.5 (ref 5.0–8.0)

## 2022-08-12 LAB — BASIC METABOLIC PANEL
Anion gap: 9 (ref 5–15)
BUN: 16 mg/dL (ref 6–20)
CO2: 24 mmol/L (ref 22–32)
Calcium: 9.4 mg/dL (ref 8.9–10.3)
Chloride: 106 mmol/L (ref 98–111)
Creatinine, Ser: 1 mg/dL (ref 0.61–1.24)
GFR, Estimated: >60 mL/min (ref >60)
Glucose, Bld: 109 mg/dL — ABNORMAL HIGH (ref 70–99)
Potassium: 3.8 mmol/L (ref 3.5–5.1)
Sodium: 139 mmol/L (ref 135–145)

## 2022-08-12 LAB — URINALYSIS, MICROSCOPIC (REFLEX)

## 2022-08-12 MED ORDER — OXYCODONE-ACETAMINOPHEN 5-325 MG PO TABS: 1.0000 | ORAL_TABLET | Freq: Four times a day (QID) | ORAL | 0 refills | Status: DC | PRN

## 2022-08-12 MED ORDER — ONDANSETRON HCL 4 MG/2ML IJ SOLN
4.0000 mg | Freq: Once | INTRAMUSCULAR | Status: AC
  Administered 2022-08-12: 4 mg via INTRAVENOUS
  Filled 2022-08-12: qty 2

## 2022-08-12 MED ORDER — KETOROLAC TROMETHAMINE 30 MG/ML IJ SOLN
30.0000 mg | Freq: Once | INTRAMUSCULAR | Status: AC
  Administered 2022-08-12: 30 mg via INTRAVENOUS
  Filled 2022-08-12: qty 1

## 2022-08-12 MED ORDER — HYDROMORPHONE HCL 1 MG/ML IJ SOLN
1.0000 mg | Freq: Once | INTRAMUSCULAR | Status: AC
  Administered 2022-08-12: 1 mg via INTRAVENOUS
  Filled 2022-08-12: qty 1

## 2022-08-12 MED ORDER — TAMSULOSIN HCL 0.4 MG PO CAPS: 0.4000 mg | ORAL_CAPSULE | Freq: Two times a day (BID) | ORAL | 0 refills | Status: AC

## 2022-08-12 MED ORDER — ONDANSETRON 4 MG PO TBDP: 4.0000 mg | ORAL_TABLET | Freq: Three times a day (TID) | ORAL | 0 refills | Status: AC | PRN

## 2022-08-12 NOTE — ED Notes (Signed)
Pt declined discharge vitals

## 2022-08-12 NOTE — ED Provider Notes (Signed)
[facility] Emergency Department Provider Note MRN:  [id number]  Arrival date & time: 08/12/22     Chief Complaint   Flank Pain   History of Present Illness   Darren Rowland is a 36 y.o. year-old male presents to the ED with chief complaint of left-sided flank pain.  Onset was 8 PM last night and significantly worsened about 2 hours ago.  He states that this feels like prior kidney stones.  He reports having darkened urine.  States that the pain is 10 out of 10.  Reports associated nausea, but denies vomiting.  Nothing makes the symptoms better or worse.  History provided by patient.   Review of Systems  Pertinent positive and negative review of systems noted in HPI.    Physical Exam   Vitals:   08/12/22 0224  BP: (!) 147/102  Pulse: 82  Resp: 20  Temp: (!) 97.5 F (36.4 C)  SpO2: 100%    CONSTITUTIONAL:  uncomfortable-appearing, NAD NEURO:  Alert and oriented x 3, CN 3-12 grossly intact EYES:  eyes equal and reactive ENT/NECK:  Supple, no stridor  CARDIO:  appears well-perfused  PULM:  No respiratory distress,  GI/GU:  non-distended,  MSK/SPINE:  No gross deformities, no edema, moves all extremities  SKIN:  no rash, atraumatic   *Additional and/or pertinent findings included in MDM below  Diagnostic and Interventional Summary     Labs Reviewed  URINALYSIS, ROUTINE W REFLEX MICROSCOPIC - Abnormal; Notable for the following components:      Result Value   Hgb urine dipstick LARGE (*)    All other components within normal limits  BASIC METABOLIC PANEL - Abnormal; Notable for the following components:   Glucose, Bld 109 (*)    All other components within normal limits  URINALYSIS, MICROSCOPIC (REFLEX) - Abnormal; Notable for the following components:   Bacteria, UA RARE (*)    All other components within normal limits  CBC WITH DIFFERENTIAL/PLATELET    CT Renal Stone Study  Final Result      Medications  ketorolac (TORADOL) 30 MG/ML  injection 30 mg (30 mg Intravenous Given 08/12/22 0249)  HYDROmorphone (DILAUDID) injection 1 mg (1 mg Intravenous Given 08/12/22 0249)  ondansetron (ZOFRAN) injection 4 mg (4 mg Intravenous Given 08/12/22 0250)     Procedures  /  Critical Care Procedures  ED Course and Medical Decision Making  I have reviewed the triage vital signs, the nursing notes, and pertinent available records from the EMR.  Social Determinants Affecting Complexity of Care: Patient has no clinically significant social determinants affecting this chief complaint..   ED Course:    Medical Decision Making Patient here with sudden onset left-sided flank pain.  History of kidney stones.  States this feels similar.  Physical exam concerning for kidney stone.  We will treat pain.  Check labs and CT.  Will reassess.  Patient feels significantly improved after pain medication.  Labs are discussed as below.  We will have patient follow-up with urology.  He understands and agrees with plan.  I sent an epic inbox message to the on-call urologist to hopefully assist with scheduling follow-up.  Amount and/or Complexity of Data Reviewed Labs: ordered.    Details: No AKI, no leukocytosis, urinalysis not consistent with infection Radiology: ordered and independent interpretation performed.    Details: Left-sided ureteral stone present  Risk Prescription drug management.     Consultants: No consultations were needed in caring for this patient.   Treatment and Plan: I  considered admission due to patient's initial presentation, but after considering the examination and diagnostic results, patient will not require admission and can be discharged with outpatient follow-up.    Final Clinical Impressions(s) / ED Diagnoses     ICD-10-CM   1. Kidney stone  N20.0       ED Discharge Orders          Ordered    oxyCODONE-acetaminophen (PERCOCET/ROXICET) 5-325 MG tablet  Every 6 hours PRN        08/12/22 0544     tamsulosin (FLOMAX) 0.4 MG CAPS capsule  2 times daily        08/12/22 0544    ondansetron (ZOFRAN-ODT) 4 MG disintegrating tablet  Every 8 hours PRN        08/12/22 0544              Discharge Instructions Discussed with and Provided to Patient:   Discharge Instructions   None      Roxy Horseman, PA-C 08/12/22 0546    Dione Booze, MD 08/12/22 [phone]

## 2022-08-12 NOTE — ED Triage Notes (Signed)
Pt reports L sided pain. Reports extensive hx of kidney stone and states this feels the same. States that dark urine started around 8p and pain started about 2 hours ago.

## 2022-08-14 ENCOUNTER — Other Ambulatory Visit: Payer: Self-pay | Admitting: Urology

## 2022-08-16 NOTE — Patient Instructions (Addendum)
SURGICAL WAITING ROOM VISITATION Patients having surgery or a procedure may have no more than 2 support people in the waiting area - these visitors may rotate.   Children under the age of 36 must have an adult with them who is not the patient. If the patient needs to stay at the hospital during part of their recovery, the visitor guidelines for inpatient rooms apply. Pre-op nurse will coordinate an appropriate time for 1 support person to accompany patient in pre-op.  This support person may not rotate.    Please refer to the [facility] website for the visitor guidelines for Inpatients (after your surgery is over and you are in a regular room).    Your procedure is scheduled on: 08/20/22   Report to [facility] Main Entrance    Report to admitting at 10:30 AM   Call this number if you have problems the morning of surgery [phone]   Do not eat food :After Midnight.   After Midnight you may have the following liquids until 9:30 AM DAY OF SURGERY  Water Non-Citrus Juices (without pulp, NO RED) Carbonated Beverages Black Coffee (NO MILK/CREAM OR CREAMERS, sugar ok)  Clear Tea (NO MILK/CREAM OR CREAMERS, sugar ok) regular and decaf                             Plain Jell-O (NO RED)                                           Fruit ices (not with fruit pulp, NO RED)                                     Popsicles (NO RED)                                                               Sports drinks like Gatorade (NO RED)          If you have questions, please contact your surgeon's office.   FOLLOW BOWEL PREP AND ANY ADDITIONAL PRE OP INSTRUCTIONS YOU RECEIVED FROM YOUR SURGEON'S OFFICE!!!     Oral Hygiene is also important to reduce your risk of infection.                                    Remember - BRUSH YOUR TEETH THE MORNING OF SURGERY WITH YOUR REGULAR TOOTHPASTE   Do NOT smoke after Midnight   Take these medicines the morning of surgery with A SIP OF WATER: Toradol,  Flomax                              You may not have any metal on your body including jewelry, and body piercing             Do not wear lotions, powders, cologne, or deodorant              Men  may shave face and neck.   Do not bring valuables to the hospital. [facility] IS NOT             RESPONSIBLE   FOR VALUABLES.  DO NOT BRING YOUR HOME MEDICATIONS TO THE HOSPITAL. PHARMACY WILL DISPENSE MEDICATIONS LISTED ON YOUR MEDICATION LIST TO YOU DURING YOUR ADMISSION IN THE HOSPITAL!    Patients discharged on the day of surgery will not be allowed to drive home.  Someone NEEDS to stay with you for the first 24 hours after anesthesia.              Please read over the following fact sheets you were given: IF YOU HAVE QUESTIONS ABOUT YOUR PRE-OP INSTRUCTIONS PLEASE CALL [phone] [address]ras    If you received a COVID test during your pre-op visit  it is requested that you wear a mask when out in public, stay away from anyone that may not be feeling well and notify your surgeon if you develop symptoms. If you test positive for Covid or have been in contact with anyone that has tested positive in the last 10 days please notify you surgeon.     [facility] - Preparing for Surgery Before surgery, you can play an important role.  Because skin is not sterile, your skin needs to be as free of germs as possible.  You can reduce the number of germs on your skin by washing with CHG (chlorahexidine gluconate) soap before surgery.  CHG is an antiseptic cleaner which kills germs and bonds with the skin to continue killing germs even after washing. Please DO NOT use if you have an allergy to CHG or antibacterial soaps.  If your skin becomes reddened/irritated stop using the CHG and inform your nurse when you arrive at Short Stay. Do not shave (including legs and underarms) for at least 48 hours prior to the first CHG shower.  You may shave your face/neck.  Please follow these instructions carefully:  1.   Shower with CHG Soap the night before surgery and the  morning of surgery.  2.  If you choose to wash your hair, wash your hair first as usual with your normal  shampoo.  3.  After you shampoo, rinse your hair and body thoroughly to remove the shampoo.                             4.  Use CHG as you would any other liquid soap.  You can apply chg directly to the skin and wash.  Gently with a scrungie or clean washcloth.  5.  Apply the CHG Soap to your body ONLY FROM THE NECK DOWN.   Do   not use on face/ open                           Wound or open sores. Avoid contact with eyes, ears mouth and   genitals (private parts).                       Wash face,  Genitals (private parts) with your normal soap.             6.  Wash thoroughly, paying special attention to the area where your    surgery  will be performed.  7.  Thoroughly rinse your body with warm water from the neck down.  8.  DO  NOT shower/wash with your normal soap after using and rinsing off the CHG Soap.                9.  Pat yourself dry with a clean towel.            10.  Wear clean pajamas.            11.  Place clean sheets on your bed the night of your first shower and do not  sleep with pets. Day of Surgery : Do not apply any lotions/deodorants the morning of surgery.  Please wear clean clothes to the hospital/surgery center.  FAILURE TO FOLLOW THESE INSTRUCTIONS MAY RESULT IN THE CANCELLATION OF YOUR SURGERY  PATIENT SIGNATURE_________________________________  NURSE SIGNATURE__________________________________  ________________________________________________________________________

## 2022-08-17 ENCOUNTER — Encounter (HOSPITAL_COMMUNITY)
Admission: RE | Admit: 2022-08-17 | Discharge: 2022-08-17 | Disposition: A | Discharge status: Home / Self Care | Payer: Managed Care, Other (non HMO) | Source: Ambulatory Visit | Attending: Urology | Admitting: Urology

## 2022-08-17 ENCOUNTER — Encounter (HOSPITAL_COMMUNITY): Payer: Self-pay

## 2022-08-17 DIAGNOSIS — Z01818 Encounter for other preprocedural examination: Secondary | ICD-10-CM | POA: Insufficient documentation

## 2022-08-17 HISTORY — DX: Depression, unspecified: F32.A

## 2022-08-17 HISTORY — DX: Anxiety disorder, unspecified: F41.9

## 2022-08-17 HISTORY — DX: Other intervertebral disc displacement, lumbar region: M51.26

## 2022-08-17 HISTORY — DX: Personal history of urinary calculi: Z87.442

## 2022-08-17 NOTE — Progress Notes (Signed)
COVID Vaccine Completed: yes  Date of COVID positive in last 90 days: no  PCP - n/a Cardiologist - n/a  Chest x-ray - n/a EKG - n/a Stress Test - n/a ECHO - n/a Cardiac Cath - n/a Pacemaker/ICD device last checked: n/a Spinal Cord Stimulator: n/a  Bowel Prep - no  Sleep Study - n/a CPAP -   Fasting Blood Sugar - n/a Checks Blood Sugar _____ times a day  Blood Thinner Instructions: n/a Aspirin Instructions: Last Dose:  Activity level: Can go up a flight of stairs and perform activities of daily living without stopping and without symptoms of chest pain or shortness of breath.    Anesthesia review:   Patient denies shortness of breath, fever, cough and chest pain at PAT appointment  Patient verbalized understanding of instructions that were given to them at the PAT appointment. Patient was also instructed that they will need to review over the PAT instructions again at home before surgery.

## 2022-08-20 ENCOUNTER — Ambulatory Visit (HOSPITAL_BASED_OUTPATIENT_CLINIC_OR_DEPARTMENT_OTHER): Payer: Self-pay | Admitting: Certified Registered Nurse Anesthetist

## 2022-08-20 ENCOUNTER — Other Ambulatory Visit: Payer: Self-pay

## 2022-08-20 ENCOUNTER — Ambulatory Visit (HOSPITAL_COMMUNITY): Payer: Self-pay | Admitting: Certified Registered Nurse Anesthetist

## 2022-08-20 ENCOUNTER — Encounter (HOSPITAL_COMMUNITY): Payer: Self-pay | Admitting: Urology

## 2022-08-20 ENCOUNTER — Ambulatory Visit (HOSPITAL_COMMUNITY): Payer: Self-pay

## 2022-08-20 ENCOUNTER — Encounter (HOSPITAL_COMMUNITY)
Admission: RE | Disposition: A | Discharge status: Home / Self Care | Payer: Self-pay | Source: Home / Self Care | Attending: Urology

## 2022-08-20 ENCOUNTER — Ambulatory Visit (HOSPITAL_COMMUNITY)
Admission: RE | Admit: 2022-08-20 | Discharge: 2022-08-20 | Disposition: A | Discharge status: Home / Self Care | Payer: Self-pay | Attending: Urology | Admitting: Urology

## 2022-08-20 DIAGNOSIS — N132 Hydronephrosis with renal and ureteral calculous obstruction: Secondary | ICD-10-CM | POA: Insufficient documentation

## 2022-08-20 DIAGNOSIS — Z79899 Other long term (current) drug therapy: Secondary | ICD-10-CM | POA: Insufficient documentation

## 2022-08-20 DIAGNOSIS — F17220 Nicotine dependence, chewing tobacco, uncomplicated: Secondary | ICD-10-CM | POA: Insufficient documentation

## 2022-08-20 DIAGNOSIS — Z87442 Personal history of urinary calculi: Secondary | ICD-10-CM | POA: Insufficient documentation

## 2022-08-20 HISTORY — PX: CYSTOSCOPY/URETEROSCOPY/HOLMIUM LASER/STENT PLACEMENT: SHX6546

## 2022-08-20 SURGERY — CYSTOSCOPY/URETEROSCOPY/HOLMIUM LASER/STENT PLACEMENT
Anesthesia: General | Laterality: Left

## 2022-08-20 MED ORDER — KETOROLAC TROMETHAMINE 10 MG PO TABS: 10.0000 mg | ORAL_TABLET | Freq: Three times a day (TID) | ORAL | 0 refills | Status: AC | PRN

## 2022-08-20 MED ORDER — PROPOFOL 10 MG/ML IV BOLUS
INTRAVENOUS | Status: AC
  Filled 2022-08-20: qty 20

## 2022-08-20 MED ORDER — FENTANYL CITRATE (PF) 100 MCG/2ML IJ SOLN
INTRAMUSCULAR | Status: DC | PRN
  Administered 2022-08-20 (×2): 50 ug via INTRAVENOUS

## 2022-08-20 MED ORDER — MIDAZOLAM HCL 2 MG/2ML IJ SOLN
INTRAMUSCULAR | Status: AC
  Filled 2022-08-20: qty 2

## 2022-08-20 MED ORDER — MIDAZOLAM HCL 5 MG/5ML IJ SOLN
INTRAMUSCULAR | Status: DC | PRN
  Administered 2022-08-20: 2 mg via INTRAVENOUS

## 2022-08-20 MED ORDER — SODIUM CHLORIDE 0.9 % IR SOLN
Status: DC | PRN
  Administered 2022-08-20: 6000 mL via INTRAVESICAL

## 2022-08-20 MED ORDER — FENTANYL CITRATE (PF) 100 MCG/2ML IJ SOLN
INTRAMUSCULAR | Status: AC
  Filled 2022-08-20: qty 2

## 2022-08-20 MED ORDER — ONDANSETRON HCL 4 MG/2ML IJ SOLN
INTRAMUSCULAR | Status: AC
  Filled 2022-08-20: qty 2

## 2022-08-20 MED ORDER — ONDANSETRON HCL 4 MG/2ML IJ SOLN
INTRAMUSCULAR | Status: DC | PRN
  Administered 2022-08-20: 4 mg via INTRAVENOUS

## 2022-08-20 MED ORDER — LACTATED RINGERS IV SOLN: INTRAVENOUS | Status: DC

## 2022-08-20 MED ORDER — FENTANYL CITRATE PF 50 MCG/ML IJ SOSY
PREFILLED_SYRINGE | INTRAMUSCULAR | Status: AC
  Filled 2022-08-20: qty 1

## 2022-08-20 MED ORDER — PROPOFOL 10 MG/ML IV BOLUS
INTRAVENOUS | Status: DC | PRN
  Administered 2022-08-20: 200 mg via INTRAVENOUS

## 2022-08-20 MED ORDER — OXYCODONE HCL 5 MG PO TABS
ORAL_TABLET | ORAL | Status: AC
  Filled 2022-08-20: qty 1

## 2022-08-20 MED ORDER — DEXAMETHASONE SODIUM PHOSPHATE 10 MG/ML IJ SOLN
INTRAMUSCULAR | Status: AC
  Filled 2022-08-20: qty 1

## 2022-08-20 MED ORDER — CEFAZOLIN SODIUM-DEXTROSE 2-4 GM/100ML-% IV SOLN
2.0000 g | INTRAVENOUS | Status: AC
  Administered 2022-08-20: 2 g via INTRAVENOUS
  Filled 2022-08-20: qty 100

## 2022-08-20 MED ORDER — OXYCODONE HCL 5 MG PO TABS
5.0000 mg | ORAL_TABLET | Freq: Once | ORAL | Status: AC
  Administered 2022-08-20: 5 mg via ORAL

## 2022-08-20 MED ORDER — OXYCODONE-ACETAMINOPHEN 5-325 MG PO TABS: 1.0000 | ORAL_TABLET | Freq: Four times a day (QID) | ORAL | 0 refills | Status: AC | PRN

## 2022-08-20 MED ORDER — ACETAMINOPHEN 500 MG PO TABS
1000.0000 mg | ORAL_TABLET | Freq: Once | ORAL | Status: AC
  Administered 2022-08-20: 1000 mg via ORAL
  Filled 2022-08-20: qty 2

## 2022-08-20 MED ORDER — LIDOCAINE 2% (20 MG/ML) 5 ML SYRINGE
INTRAMUSCULAR | Status: DC | PRN
  Administered 2022-08-20: 80 mg via INTRAVENOUS

## 2022-08-20 MED ORDER — ORAL CARE MOUTH RINSE: 15.0000 mL | Freq: Once | OROMUCOSAL | Status: DC

## 2022-08-20 MED ORDER — DEXAMETHASONE SODIUM PHOSPHATE 10 MG/ML IJ SOLN
INTRAMUSCULAR | Status: DC | PRN
  Administered 2022-08-20: 4 mg via INTRAVENOUS

## 2022-08-20 MED ORDER — FENTANYL CITRATE PF 50 MCG/ML IJ SOSY
25.0000 ug | PREFILLED_SYRINGE | INTRAMUSCULAR | Status: DC | PRN
  Administered 2022-08-20 (×2): 50 ug via INTRAVENOUS

## 2022-08-20 MED ORDER — IOHEXOL 300 MG/ML  SOLN
INTRAMUSCULAR | Status: DC | PRN
  Administered 2022-08-20: 10 mL

## 2022-08-20 MED ORDER — CHLORHEXIDINE GLUCONATE 0.12 % MT SOLN: 15.0000 mL | Freq: Once | OROMUCOSAL | Status: DC

## 2022-08-20 SURGICAL SUPPLY — 23 items
BAG URO CATCHER STRL LF (MISCELLANEOUS) ×1 IMPLANT
BASKET LASER NITINOL 1.9FR (BASKET) IMPLANT
BASKET ZERO TIP NITINOL 2.4FR (BASKET) IMPLANT
CATH URETERAL DUAL LUMEN 10F (MISCELLANEOUS) IMPLANT
CATH URETL OPEN END 6FR 70 (CATHETERS) ×1 IMPLANT
CLOTH BEACON ORANGE TIMEOUT ST (SAFETY) ×1 IMPLANT
EXTRACTOR STONE 1.7FRX115CM (UROLOGICAL SUPPLIES) IMPLANT
GLOVE BIO SURGEON STRL SZ7.5 (GLOVE) ×1 IMPLANT
GOWN STRL REUS W/ TWL XL LVL3 (GOWN DISPOSABLE) ×1 IMPLANT
GOWN STRL REUS W/TWL XL LVL3 (GOWN DISPOSABLE) ×1
GUIDEWIRE ANG ZIPWIRE 038X150 (WIRE) IMPLANT
GUIDEWIRE STR DUAL SENSOR (WIRE) ×1 IMPLANT
KIT TURNOVER KIT A (KITS) IMPLANT
LASER FIB FLEXIVA PULSE ID 365 (Laser) IMPLANT
MANIFOLD NEPTUNE II (INSTRUMENTS) ×1 IMPLANT
PACK CYSTO (CUSTOM PROCEDURE TRAY) ×1 IMPLANT
SHEATH NAVIGATOR HD 11/13X28 (SHEATH) IMPLANT
SHEATH NAVIGATOR HD 11/13X36 (SHEATH) IMPLANT
STENT URET 6FRX26 CONTOUR (STENTS) IMPLANT
TRACTIP FLEXIVA PULS ID 200XHI (Laser) IMPLANT
TRACTIP FLEXIVA PULSE ID 200 (Laser)
TUBING CONNECTING 10 (TUBING) ×1 IMPLANT
TUBING UROLOGY SET (TUBING) ×1 IMPLANT

## 2022-08-20 NOTE — Anesthesia Procedure Notes (Signed)
Procedure Name: LMA Insertion Date/Time: 08/20/2022 12:24 PM  Performed by: Niel Hummer, CRNAPre-anesthesia Checklist: Patient identified, Emergency Drugs available, Suction available and Patient being monitored Patient Re-evaluated:Patient Re-evaluated prior to induction Oxygen Delivery Method: Circle system utilized Preoxygenation: Pre-oxygenation with 100% oxygen Induction Type: IV induction LMA: LMA with gastric port inserted LMA Size: 4.0 Number of attempts: 1 Dental Injury: Teeth and Oropharynx as per pre-operative assessment

## 2022-08-20 NOTE — Anesthesia Postprocedure Evaluation (Signed)
Anesthesia Post Note  Patient: Darren Rowland  Procedure(s) Performed: CYSTOSCOPY LEFT URETEROSCOPY/STENT PLACEMENT (Left)     Patient location during evaluation: PACU Anesthesia Type: General Level of consciousness: awake and alert Pain management: pain level controlled Vital Signs Assessment: post-procedure vital signs reviewed and stable Respiratory status: spontaneous breathing, nonlabored ventilation and respiratory function stable Cardiovascular status: blood pressure returned to baseline and stable Postop Assessment: no apparent nausea or vomiting Anesthetic complications: no   No notable events documented.  Last Vitals:  Vitals:   08/20/22 1330 08/20/22 1345  BP: (!) 143/96 131/85  Pulse: 78 61  Resp: 15 13  Temp:    SpO2: 96% 96%    Last Pain:  Vitals:   08/20/22 1345  TempSrc:   PainSc: 4                  Kersti Scavone,W. EDMOND

## 2022-08-20 NOTE — Transfer of Care (Signed)
Immediate Anesthesia Transfer of Care Note  Patient: Darren Rowland  Procedure(s) Performed: CYSTOSCOPY LEFT URETEROSCOPY/STENT PLACEMENT (Left)  Patient Location: PACU  Anesthesia Type:General  Level of Consciousness: drowsy  Airway & Oxygen Therapy: Patient Spontanous Breathing and Patient connected to face mask oxygen  Post-op Assessment: Report given to RN, Post -op Vital signs reviewed and stable and Patient moving all extremities X 4  Post vital signs: Reviewed and stable  Last Vitals:  Vitals Value Taken Time  BP 142/78   Temp    Pulse 78   Resp 12   SpO2 99     Last Pain:  Vitals:   08/20/22 1040  TempSrc: Oral         Complications: No notable events documented.

## 2022-08-20 NOTE — H&P (Signed)
CC/HPI: CC: Left ureteral calculus  HPI:  08/14/2022  36 year old male went to the emergency department on Sunday with severe left-sided flank pain. He has a history of nephrolithiasis. He has passed about 10 stones in the past. He has required ureteroscopy x2. Last ureteroscopy was in 2019. In the emergency department CT scan revealed a 5 mm proximal left ureteral calculus. He continues to have left-sided flank pain, nausea. No fever, chills, vomiting. Denies any dysuria. Toradol works the best for his pain. He is also taking some phenazopyridine which helps with the relief. Has Zofran and has been taking tamsulosin. No signs of infection on urinalysis on Sunday. Creatinine was 1, white blood cell count 9.8.     ALLERGIES: Morphine Hcl    MEDICATIONS: Ketorolac Tromethamine  Phenazopyridine Hcl 100 mg tablet  Tamsulosin Hcl 0.4 mg capsule     GU PSH: None   NON-GU PSH: Appendectomy (laparoscopic) Knee Arthroscopy/surgery, Left     GU PMH: None   NON-GU PMH: Anxiety    FAMILY HISTORY: None   SOCIAL HISTORY: Marital Status: Single Preferred Language: English; Race: White Current Smoking Status: Patient smokes.   Tobacco Use Assessment Completed: Used Tobacco in last 30 days? Uses smokeless tobacco. Drinks 1 caffeinated drink per day.    REVIEW OF SYSTEMS:    GU Review Male:   Patient reports frequent urination, burning/ pain with urination, stream starts and stops, trouble starting your stream, and have to strain to urinate . Patient denies hard to postpone urination, get up at night to urinate, leakage of urine, erection problems, and penile pain.  Gastrointestinal (Upper):   Patient reports nausea. Patient denies vomiting and indigestion/ heartburn.  Gastrointestinal (Lower):   Patient reports constipation. Patient denies diarrhea.  Constitutional:   Patient denies fever, night sweats, weight loss, and fatigue.  Skin:   Patient denies skin rash/ lesion and itching.  Eyes:    Patient denies blurred vision and double vision.  Ears/ Nose/ Throat:   Patient denies sore throat and sinus problems.  Hematologic/Lymphatic:   Patient denies swollen glands and easy bruising.  Cardiovascular:   Patient denies leg swelling and chest pains.  Respiratory:   Patient denies cough and shortness of breath.  Endocrine:   Patient denies excessive thirst.  Musculoskeletal:   Patient reports back pain. Patient denies joint pain.  Neurological:   Patient denies headaches and dizziness.  Psychologic:   Patient denies depression and anxiety.   Notes: UTI    VITAL SIGNS:      10 /24/2023 08:24 AM  Weight 265 lb / 120.2 kg  Height 71 in / 180.34 cm  BP 129/87 mmHg  Heart Rate 96 /min  Temperature 98.4 F / 36.8 C  BMI 37.0 kg/m   MULTI-SYSTEM PHYSICAL EXAMINATION:    Constitutional: Well-nourished. No physical deformities. Normally developed. Good grooming.  Respiratory: No labored breathing, no use of accessory muscles.   Cardiovascular: Normal temperature, normal extremity pulses, no swelling, no varicosities.  Skin: No paleness, no jaundice, no cyanosis. No lesion, no ulcer, no rash.  Neurologic / Psychiatric: Oriented to time, oriented to place, oriented to person. No depression, no anxiety, no agitation.  Gastrointestinal: No mass, no tenderness, no rigidity, non obese abdomen. No CVA tenderness bilaterally  Eyes: Normal conjunctivae. Normal eyelids.  Musculoskeletal: Normal gait and station of head and neck.     Complexity of Data:  Source Of History:  Patient  Lab Test Review:   BMP, CBC with Diff  Records Review:  Previous Doctor Records, Previous Patient Records  Urine Test Review:   Urinalysis  X-Ray Review: C.T. Abdomen/Pelvis: Reviewed Films. Reviewed Report. Discussed With Patient.     PROCEDURES:          Urinalysis w/Scope - 81001 Dipstick Dipstick Cont'd Micro  Color: Orange Bilirubin: Invalid WBC/hpf: 6 - 10/hpf  Appearance: Slightly Cloudy Ketones:  Invalid RBC/hpf: 10 - 20/hpf  Specific Gravity: Invalid Blood: Invalid Bacteria: Few (10-25/hpf)  pH: Invalid Protein: Invalid Cystals: NS (Not Seen)  Glucose: Invalid Urobilinogen: Invalid Casts: Hyaline    Nitrites: Invalid Trichomonas: Not Present    Leukocyte Esterase: Invalid Mucous: Present      Epithelial Cells: 0 - 5/hpf      Yeast: NS (Not Seen)      Sperm: Not Present    Notes:  No chemistries run due to color interferance.     ASSESSMENT:      ICD-10 Details  1 GU:   Ureteral calculus - N20.1 Undiagnosed New Problem  2   Ureteral obstruction secondary to calculous - N13.2 Undiagnosed New Problem  3   Renal colic - T90 Undiagnosed New Problem   PLAN:            Medications New Meds: Ketorolac Tromethamine 10 mg tablet 1 tablet PO Q 8 H PRN   #15  0 Refill(s)  Phenazopyridine Hcl 100 mg tablet 1 tablet PO Q 8 H PRN   #20  0 Refill(s)  Pharmacy Name:  [id number]  Address:  [place] [address]  Phone:  [phone]  Fax:  [phone]            Document Letter(s):  Created for Patient: Clinical Summary         Notes:   We discussed the management of urinary stones. These options include observation, ureteroscopy, and shockwave lithotripsy. We discussed which options are relevant to these particular stones. We discussed the natural history of stones as well as the complications of untreated stones and the impact on quality of life without treatment as well as with each of the above listed treatments. We also discussed the efficacy of each treatment in its ability to clear the stone burden. With any of these management options I discussed the signs and symptoms of infection and the need for emergent treatment should these be experienced. For each option we discussed the ability of each procedure to clear the patient of their stone burden.   For observation I described the risks which include but are not limited to silent  renal damage, life-threatening infection, need for emergent surgery, failure to pass stone, and pain.   For ureteroscopy I described the risks which include heart attack, stroke, pulmonary embolus, death, bleeding, infection, damage to contiguous structures, positioning injury, ureteral stricture, ureteral avulsion, ureteral injury, need for ureteral stent, inability to perform ureteroscopy, need for an interval procedure, inability to clear stone burden, stent discomfort and pain.   For shockwave lithotripsy I described the risks which include arrhythmia, kidney contusion, kidney hemorrhage, need for transfusion, pain, inability to break up stone, inability to pass stone fragments, Steinstrasse, infection associated with obstructing stones, need for different surgical procedure, need for repeat shockwave lithotripsy.    Proceed with ureteroscopy.        Next Appointment:      Next Appointment: 08/20/2022 12:30 PM    Appointment Type: Surgery     Location: [facility] [phone]  Provider: Modena Slater, III, M.D.    Reason for Visit: OP WL CYSTO LT URS LL STENT      Signed by Modena Slater, III, M.D. on 08/14/22 at 2:37 PM (EDT

## 2022-08-20 NOTE — Op Note (Signed)
Operative Note  Preoperative diagnosis:  1.  Left ureteral calculus  Postoperative diagnosis: 1.  Left ureteral calculus  Procedure(s): 1.  Cystoscopy with left retrograde pyelogram, left ureteroscopy with stone extraction, left ureteral stent placement  Surgeon: Link Snuffer, MD  Assistants: None  Anesthesia: General  Complications: None immediate  EBL: Minimal  Specimens: 1.  Ureteral calculus  Drains/Catheters: 1.  6 x 26 double-J ureteral stent on a string  Intraoperative findings: 1.  Normal urethra and bladder 2.  7 mm distal left ureteral calculus basket extracted.  Remainder of ureter free of stone.  Retrograde pyelogram revealed mild to moderate left hydronephrosis.  Indication: 36 year old male with a left ureteral calculus presents for the previously mentioned operation.  Description of procedure:  The patient was identified and consent was obtained.  The patient was taken to the operating room and placed in the supine position.  The patient was placed under general anesthesia.  Perioperative antibiotics were administered.  The patient was placed in dorsal lithotomy.  Patient was prepped and draped in a standard sterile fashion and a timeout was performed.  A 21 French rigid cystoscope was advanced into the urethra and into the bladder.  Complete cystoscopy was performed with no abnormal findings.  The left ureter was cannulated with a sensor wire which was advanced up to the kidney under fluoroscopic guidance.  Semirigid ureteroscopy was performed.  The ureteral calculus was right at the ureteral orifice.  Basket was used to extract the stone.  I advanced the semirigid ureteroscope up the ureter into the renal pelvis and no other stones were identified.  Retrograde pyelogram was performed through the scope.  I withdrew the scope.  I advanced the cystoscope into the bladder and evacuated the stone.  I then withdrew the scope and advanced a 6 x 26 double-J ureteral stent  over the wire under fluoroscopic guidance and withdrew the wire.  Fluoroscopy confirmed proximal and distal placement.  The string was secured to the penis.  This include the operation.  Patient tolerated procedure well stable postoperative.  Plan: He may pull the stent on Thursday morning.

## 2022-08-20 NOTE — Discharge Instructions (Addendum)
[facility] [phone] Post Ureteroscopy With or Without Stent Instructions  You may remove the stent on Thursday morning.  Definitions:  Ureter: The duct that transports urine from the kidney to the bladder. Stent:   A plastic hollow tube that is placed into the ureter, from the kidney to the                 bladder to prevent the ureter from swelling shut.  GENERAL INSTRUCTIONS:  Despite the fact that no skin incisions were used, the area around the ureter and bladder is raw and irritated. The stent is a foreign body which will further irritate the bladder wall. This irritation is manifested by increased frequency of urination, both day and night, and by an increase in the urge to urinate. In some, the urge to urinate is present almost always. Sometimes the urge is strong enough that you may not be able to stop yourself from urinating. The only real cure is to remove the stent and then give time for the bladder wall to heal which can't be done until the danger of the ureter swelling shut has passed, which varies.  You may see some blood in your urine while the stent is in place and a few days afterwards. Do not be alarmed, even if the urine was clear for a while. Get off your feet and drink lots of fluids until clearing occurs. If you start to pass clots or don't improve, call us.  DIET: You may return to your normal diet immediately. Because of the raw surface of your bladder, alcohol, spicy foods, acid type foods and drinks with caffeine may cause irritation or frequency and should be used in moderation. To keep your urine flowing freely and to avoid constipation, drink plenty of fluids during the day ( 8-10 glasses ). Tip: Avoid cranberry juice because it is very acidic.  ACTIVITY: Your physical activity doesn't need to be restricted. However, if you are very active, you may see some blood in your urine. We suggest that you reduce your activity under these circumstances  until the bleeding has stopped.  BOWELS: It is important to keep your bowels regular during the postoperative period. Straining with bowel movements can cause bleeding. A bowel movement every other day is reasonable. Use a mild laxative if needed, such as Milk of Magnesia 2-3 tablespoons, or 2 Dulcolax tablets. Call if you continue to have problems. If you have been taking narcotics for pain, before, during or after your surgery, you may be constipated. Take a laxative if necessary.   MEDICATION: You should resume your pre-surgery medications unless told not to. You may take oxybutynin or flomax if prescribed for bladder spasms or discomfort from the stent Take pain medication as directed for pain refractory to conservative management  PROBLEMS YOU SHOULD REPORT TO [place]: Fevers over 100.5 Fahrenheit. Heavy bleeding, or clots ( See above notes about blood in urine ). Inability to urinate. Drug reactions ( hives, rash, nausea, vomiting, diarrhea ). Severe burning or pain with urination that is not improving.

## 2022-08-20 NOTE — Anesthesia Preprocedure Evaluation (Addendum)
Anesthesia Evaluation  Patient identified by MRN, date of birth, ID band Patient awake    Reviewed: Allergy & Precautions, H&P , NPO status , Patient's Chart, lab work & pertinent test results  Airway Mallampati: I  TM Distance: >3 FB Neck ROM: Full    Dental no notable dental hx. (+) Poor Dentition, Dental Advisory Given   Pulmonary Current Smoker and Patient abstained from smoking.,    Pulmonary exam normal breath sounds clear to auscultation       Cardiovascular negative cardio ROS   Rhythm:Regular Rate:Normal     Neuro/Psych Anxiety Depression negative neurological ROS     GI/Hepatic negative GI ROS, Neg liver ROS,   Endo/Other  negative endocrine ROS  Renal/GU negative Renal ROS  negative genitourinary   Musculoskeletal   Abdominal   Peds  Hematology negative hematology ROS (+)   Anesthesia Other Findings   Reproductive/Obstetrics negative OB ROS                            Anesthesia Physical Anesthesia Plan  ASA: 2  Anesthesia Plan: General   Post-op Pain Management: Tylenol PO (pre-op)*   Induction: Intravenous  PONV Risk Score and Plan: 2 and Ondansetron, Dexamethasone and Midazolam  Airway Management Planned: LMA  Additional Equipment:   Intra-op Plan:   Post-operative Plan: Extubation in OR  Informed Consent: I have reviewed the patients History and Physical, chart, labs and discussed the procedure including the risks, benefits and alternatives for the proposed anesthesia with the patient or authorized representative who has indicated his/her understanding and acceptance.     Dental advisory given  Plan Discussed with: CRNA  Anesthesia Plan Comments:         Anesthesia Quick Evaluation

## 2022-08-21 ENCOUNTER — Encounter (HOSPITAL_COMMUNITY): Payer: Self-pay | Admitting: Urology

## 2023-08-10 ENCOUNTER — Other Ambulatory Visit: Payer: Self-pay

## 2023-08-10 ENCOUNTER — Encounter (HOSPITAL_BASED_OUTPATIENT_CLINIC_OR_DEPARTMENT_OTHER): Payer: Self-pay

## 2023-08-10 DIAGNOSIS — W1839XA Other fall on same level, initial encounter: Secondary | ICD-10-CM | POA: Insufficient documentation

## 2023-08-10 DIAGNOSIS — S93401A Sprain of unspecified ligament of right ankle, initial encounter: Secondary | ICD-10-CM | POA: Diagnosis not present

## 2023-08-10 DIAGNOSIS — S99911A Unspecified injury of right ankle, initial encounter: Secondary | ICD-10-CM | POA: Diagnosis present

## 2023-08-10 NOTE — ED Notes (Signed)
Pt declined ice and Tylenol and/or Ibuprofen at this time. He was instructed to let staff know if he changes his mind

## 2023-08-10 NOTE — ED Triage Notes (Signed)
Pt states that he was walking his dog approx 2 hours ago and twisted his right ankle causing him to fall. Pt reports swelling and pain to right ankle, worse with weightbearing and ambulation.

## 2023-08-11 ENCOUNTER — Emergency Department (HOSPITAL_BASED_OUTPATIENT_CLINIC_OR_DEPARTMENT_OTHER): Payer: 59

## 2023-08-11 ENCOUNTER — Emergency Department (HOSPITAL_BASED_OUTPATIENT_CLINIC_OR_DEPARTMENT_OTHER)
Admission: EM | Admit: 2023-08-11 | Discharge: 2023-08-11 | Disposition: A | Discharge status: Home / Self Care | Payer: 59 | Attending: Emergency Medicine | Admitting: Emergency Medicine

## 2023-08-11 ENCOUNTER — Emergency Department (HOSPITAL_BASED_OUTPATIENT_CLINIC_OR_DEPARTMENT_OTHER): Payer: 59 | Admitting: Radiology

## 2023-08-11 DIAGNOSIS — S93401A Sprain of unspecified ligament of right ankle, initial encounter: Secondary | ICD-10-CM | POA: Diagnosis not present

## 2023-08-11 MED ORDER — IBUPROFEN 800 MG PO TABS
800.0000 mg | ORAL_TABLET | Freq: Once | ORAL | Status: AC
  Administered 2023-08-11: 800 mg via ORAL
  Filled 2023-08-11: qty 1

## 2023-08-11 MED ORDER — NAPROXEN 500 MG PO TABS
500.0000 mg | ORAL_TABLET | Freq: Two times a day (BID) | ORAL | 0 refills | Status: AC
Start: 2023-08-11 — End: ?

## 2023-08-11 MED ORDER — ACETAMINOPHEN 325 MG PO TABS
650.0000 mg | ORAL_TABLET | Freq: Once | ORAL | Status: AC
  Administered 2023-08-11: 650 mg via ORAL
  Filled 2023-08-11: qty 2

## 2023-08-11 NOTE — Discharge Instructions (Signed)
Your x-ray is negative for fracture or dislocation.  Take the anti-inflammatories as prescribed, use ice, elevation, bear weight as tolerated.  Follow-up with your primary doctor as well as the orthopedic doctor.  Return to the ED with new or worsening symptoms

## 2023-08-11 NOTE — ED Provider Notes (Signed)
[place] EMERGENCY DEPARTMENT AT [facility] Provider Note   CSN: [id number] Arrival date & time: 08/10/23  2332     History  Chief Complaint  Patient presents with   Ankle Pain    Darren Rowland is a 37 y.o. male.  Patient states he was walking his dog when he got pulled forward and he stumbled and fell to the ground injuring his right knee and ankle.  Did not hit his head or lose consciousness.  Complains of pain to right foot and ankle only.  No head, neck, back, chest or abdominal pain.  No blood thinner use.  No focal weakness, numbness or tingling.  Did not taken thing for pain at home. Pain is mostly to his right lateral foot and ankle.  The history is provided by the patient.  Ankle Pain Associated symptoms: no fever        Home Medications Prior to Admission medications   Medication Sig Start Date End Date Taking? Authorizing Provider  ketorolac (TORADOL) 10 MG tablet Take 1 tablet (10 mg total) by mouth every 8 (eight) hours as needed for moderate pain. 08/20/22   Crista Elliot, MD  ondansetron (ZOFRAN-ODT) 4 MG disintegrating tablet Take 1 tablet (4 mg total) by mouth every 8 (eight) hours as needed for nausea or vomiting. 08/12/22   Roxy Horseman, PA-C  oxyCODONE-acetaminophen (PERCOCET/ROXICET) 5-325 MG tablet Take 1 tablet by mouth every 6 (six) hours as needed for severe pain. 08/20/22   Crista Elliot, MD  phenazopyridine (PYRIDIUM) 100 MG tablet Take 100 mg by mouth every 8 (eight) hours as needed for pain. 08/14/22   [provider]  tamsulosin (FLOMAX) 0.4 MG CAPS capsule Take 1 capsule (0.4 mg total) by mouth 2 (two) times daily. Patient taking differently: Take 0.4 mg by mouth daily. 08/12/22   Roxy Horseman, PA-C      Allergies    Capsicum annuum extract & derivative (bell pepper) [capsicum] and Morphine and codeine    Review of Systems   Review of Systems  Constitutional:  Negative for activity change, appetite change  and fever.  HENT:  Negative for congestion and rhinorrhea.   Respiratory:  Negative for cough, chest tightness and shortness of breath.   Cardiovascular:  Negative for chest pain.  Gastrointestinal:  Negative for abdominal pain, nausea and vomiting.  Genitourinary:  Negative for dysuria and hematuria.  Musculoskeletal:  Positive for arthralgias and myalgias.  Skin:  Negative for rash.  Neurological:  Negative for dizziness, weakness and headaches.    all other systems are negative except as noted in the HPI and PMH.   Physical Exam Updated Vital Signs BP (!) 140/87   Pulse 86   Temp 98.2 F (36.8 C) (Oral)   Resp 20   Ht 5\' 11"  (1.803 m)   Wt 113.4 kg   SpO2 97%   BMI 34.87 kg/m  Physical Exam Vitals and nursing note reviewed.  Constitutional:      General: He is not in acute distress.    Appearance: He is well-developed.  HENT:     Head: Normocephalic and atraumatic.     Mouth/Throat:     Pharynx: No oropharyngeal exudate.  Eyes:     Conjunctiva/sclera: Conjunctivae normal.     Pupils: Pupils are equal, round, and reactive to light.  Neck:     Comments: No meningismus. Cardiovascular:     Rate and Rhythm: Normal rate and regular rhythm.     Heart sounds: Normal  heart sounds. No murmur heard. Pulmonary:     Effort: Pulmonary effort is normal. No respiratory distress.     Breath sounds: Normal breath sounds.  Abdominal:     Palpations: Abdomen is soft.     Tenderness: There is no abdominal tenderness. There is no guarding or rebound.  Musculoskeletal:        General: Swelling and tenderness present.     Cervical back: Normal range of motion and neck supple.     Comments: Abrasion right knee without bony tenderness.  Flexion and extension intact.  Tender to palpation right lateral malleolus and lateral foot with swelling.  Intact DP and PT pulses.  Able to wiggle toes.  No pain at base of fifth metatarsal  Skin:    General: Skin is warm.  Neurological:     Mental  Status: He is alert and oriented to person, place, and time.     Cranial Nerves: No cranial nerve deficit.     Motor: No abnormal muscle tone.     Coordination: Coordination normal.     Comments:  5/5 strength throughout. CN 2-12 intact.Equal grip strength.   Psychiatric:        Behavior: Behavior normal.     ED Results / Procedures / Treatments   Labs (all labs ordered are listed, but only abnormal results are displayed) Labs Reviewed - No data to display  EKG None  Radiology DG Ankle Complete Right  Result Date: 08/11/2023 CLINICAL DATA:  Right ankle injury due to a fall with twisting to the right ankle. Pain is worse with weight-bearing and ambulation. EXAM: RIGHT ANKLE - COMPLETE 3+ VIEW COMPARISON:  None Available. FINDINGS: Right ankle appears intact. No evidence of acute fracture or subluxation. Focal area of sclerosis in the calcaneus likely represents a benign bone island. No expansile or destructive bone lesions. Joint spaces are normal. No radiopaque soft tissue foreign bodies. IMPRESSION: No acute bony abnormalities. Electronically Signed   By: Burman Nieves M.D.   On: 08/11/2023 02:12    Procedures Procedures    Medications Ordered in ED Medications  ibuprofen (ADVIL) tablet 800 mg (has no administration in time range)  acetaminophen (TYLENOL) tablet 650 mg (has no administration in time range)    ED Course/ Medical Decision Making/ A&P                                 Medical Decision Making Amount and/or Complexity of Data Reviewed Labs: ordered. Decision-making details documented in ED Course. Radiology: ordered and independent interpretation performed. Decision-making details documented in ED Course. ECG/medicine tests: ordered and independent interpretation performed. Decision-making details documented in ED Course.  Risk OTC drugs. Prescription drug management.   Right ankle and foot pain after falling.  No head injury.  Neurovascularly  intact.  Imaging of right foot and ankle is negative for fracture or dislocation.  Results reviewed and interpreted by me.  Will treat as potential ankle sprain.  Give ASO, ice, crutches, elevation.  Follow-up with PCP as well as orthopedics.  Return to the ED with new or worsening symptoms        Final Clinical Impression(s) / ED Diagnoses Final diagnoses:  Sprain of right ankle, unspecified ligament, initial encounter    Rx / DC Orders ED Discharge Orders     None         Massiah Longanecker, Jeannett Senior, MD 08/11/23 0630

## 2023-11-30 ENCOUNTER — Emergency Department (HOSPITAL_BASED_OUTPATIENT_CLINIC_OR_DEPARTMENT_OTHER)
Admission: EM | Admit: 2023-11-30 | Discharge: 2023-11-30 | Disposition: A | Discharge status: Home / Self Care | Payer: 59 | Attending: Emergency Medicine | Admitting: Emergency Medicine

## 2023-11-30 ENCOUNTER — Other Ambulatory Visit: Payer: Self-pay

## 2023-11-30 DIAGNOSIS — U071 COVID-19: Secondary | ICD-10-CM | POA: Insufficient documentation

## 2023-11-30 DIAGNOSIS — R509 Fever, unspecified: Secondary | ICD-10-CM | POA: Diagnosis present

## 2023-11-30 LAB — RESP PANEL BY RT-PCR (RSV, FLU A&B, COVID)  RVPGX2
Influenza A by PCR: NEGATIVE
Influenza B by PCR: NEGATIVE
Resp Syncytial Virus by PCR: NEGATIVE
SARS Coronavirus 2 by RT PCR: POSITIVE — AB

## 2023-11-30 MED ORDER — ACETAMINOPHEN 325 MG PO TABS
650.0000 mg | ORAL_TABLET | Freq: Once | ORAL | Status: AC | PRN
Start: 2023-11-30 — End: 2023-11-30
  Administered 2023-11-30: 650 mg via ORAL
  Filled 2023-11-30: qty 2

## 2023-11-30 NOTE — ED Provider Notes (Signed)
 [place] EMERGENCY DEPARTMENT AT [facility] Provider Note   CSN: [id number] Arrival date & time: 11/30/23  1743     History  No chief complaint on file.   Darren Rowland is a 38 y.o. male.  With history of anxiety, depression presenting to the ED for evaluation of flulike symptoms including fever, congestion, body aches, anorexia.  Symptoms began approximately 3 days ago.  States one of his coworkers was recently diagnosed with COVID-19.  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  He denies any shortness of breath or chest pain.  No cough.  HPI     Home Medications Prior to Admission medications   Medication Sig Start Date End Date Taking? Authorizing Provider  ketorolac  (TORADOL ) 10 MG tablet Take 1 tablet (10 mg total) by mouth every 8 (eight) hours as needed for moderate pain. 08/20/22   Carolee Sherwood JONETTA DOUGLAS, MD  naproxen  (NAPROSYN ) 500 MG tablet Take 1 tablet (500 mg total) by mouth 2 (two) times daily with a meal. 08/11/23   Rancour, Garnette, MD  ondansetron  (ZOFRAN -ODT) 4 MG disintegrating tablet Take 1 tablet (4 mg total) by mouth every 8 (eight) hours as needed for nausea or vomiting. 08/12/22   Vicky Charleston, PA-C  oxyCODONE -acetaminophen  (PERCOCET/ROXICET) 5-325 MG tablet Take 1 tablet by mouth every 6 (six) hours as needed for severe pain. 08/20/22   Carolee Sherwood JONETTA DOUGLAS, MD  phenazopyridine (PYRIDIUM) 100 MG tablet Take 100 mg by mouth every 8 (eight) hours as needed for pain. 08/14/22   [provider]  tamsulosin  (FLOMAX ) 0.4 MG CAPS capsule Take 1 capsule (0.4 mg total) by mouth 2 (two) times daily. Patient taking differently: Take 0.4 mg by mouth daily. 08/12/22   Vicky Charleston, PA-C      Allergies    Capsicum annuum extract & derivative (bell pepper) [capsicum] and Morphine and codeine    Review of Systems   Review of Systems  Constitutional:  Positive for fatigue and fever.  HENT:  Positive for congestion.    Musculoskeletal:  Positive for myalgias.  All other systems reviewed and are negative.   Physical Exam Updated Vital Signs BP 121/82   Pulse (!) 108   Temp 99.4 F (37.4 C)   Resp 20   Ht 5' 11 (1.803 m)   Wt 113.4 kg   SpO2 96%   BMI 34.87 kg/m  Physical Exam Vitals and nursing note reviewed.  Constitutional:      General: He is not in acute distress.    Appearance: Normal appearance. He is normal weight. He is not ill-appearing.  HENT:     Head: Normocephalic and atraumatic.  Pulmonary:     Effort: Pulmonary effort is normal. No respiratory distress.     Breath sounds: No wheezing, rhonchi or rales.  Abdominal:     General: Abdomen is flat.  Musculoskeletal:        General: Normal range of motion.     Cervical back: Neck supple.  Skin:    General: Skin is warm and dry.  Neurological:     Mental Status: He is alert and oriented to person, place, and time.  Psychiatric:        Mood and Affect: Mood normal.        Behavior: Behavior normal.     ED Results / Procedures / Treatments   Labs (all labs ordered are listed, but only abnormal results are displayed) Labs Reviewed  RESP PANEL BY RT-PCR (RSV,  FLU A&B, COVID)  [id number] - Abnormal; Notable for the following components:      Result Value   SARS Coronavirus 2 by RT PCR POSITIVE (*)    All other components within normal limits    EKG None  Radiology No results found.  Procedures Procedures    Medications Ordered in ED Medications  acetaminophen  (TYLENOL ) tablet 650 mg (650 mg Oral Given 11/30/23 1910)    ED Course/ Medical Decision Making/ A&P                                 Medical Decision Making Risk OTC drugs.  This patient presents to the ED for concern of flulike symptoms, this involves an extensive number of treatment options, and is a complaint that carries with it a high risk of complications and morbidity.  The differential diagnosis includes flu, COVID, RSV  My initial workup  includes respiratory panel  Additional history obtained from: Nursing notes from this visit.  I ordered, reviewed and interpreted labs which include: Respiratory panel.  Positive for COVID-69  38 year old male presenting to the ED for evaluation of flulike symptoms.  Symptoms began approximately 3 days ago.  Known exposure to COVID-19 at work.  Respiratory panel is positive for COVID-19.  Likely source of symptoms.  Patient was educated on supportive care and typical timeline of symptoms.  Declines antiemetic prescription.  He was encouraged to eat frequent small meals and drink plenty of water.  He is encouraged to quarantine.  He was given return precautions.  Stable at discharge.  At this time there does not appear to be any evidence of an acute emergency medical condition and the patient appears stable for discharge with appropriate outpatient follow up. Diagnosis was discussed with patient who verbalizes understanding of care plan and is agreeable to discharge. I have discussed return precautions with patient who verbalizes understanding. Patient encouraged to follow-up with their PCP within 1 week. All questions answered.  Note: Portions of this report may have been transcribed using voice recognition software. Every effort was made to ensure accuracy; however, inadvertent computerized transcription errors may still be present.         Final Clinical Impression(s) / ED Diagnoses Final diagnoses:  COVID-19    Rx / DC Orders ED Discharge Orders     None         Edwardo Marsa HERO, DEVONNA 11/30/23 KENITH Dreama Longs, MD 12/03/23 1044

## 2023-11-30 NOTE — ED Triage Notes (Signed)
 Pt POV reporting flu-like sx x2 days + fever, last tylenol  dose 1200. Also reporting increased sinus congestion. Tolerating food and liquids at this time.

## 2023-11-30 NOTE — Discharge Instructions (Addendum)
 You have been seen today for your complaint of flulike symptoms. Your lab work was positive for COVID-19. Your discharge medications include Alternate tylenol  and ibuprofen  for pain. You may alternate these every 4 hours. You may take up to 800 mg of ibuprofen  at a time and up to 1000 mg of tylenol . Use Claritin and Flonase for congestion and runny nose. Use Robitussin DM, Mucinex DM or Delsym for cough. Follow up with: Your primary care provider Please seek immediate medical care if you develop any of the following symptoms: You have trouble breathing or get short of breath. You have pain or pressure in your chest. You cannot speak or move any part of your body. You are confused. Your symptoms get worse. At this time there does not appear to be the presence of an emergent medical condition, however there is always the potential for conditions to change. Please read and follow the below instructions.  Do not take your medicine if  develop an itchy rash, swelling in your mouth or lips, or difficulty breathing; call 911 and seek immediate emergency medical attention if this occurs.  You may review your lab tests and imaging results in their entirety on your MyChart account.  Please discuss all results of fully with your primary care provider and other specialist at your follow-up visit.  Note: Portions of this text may have been transcribed using voice recognition software. Every effort was made to ensure accuracy; however, inadvertent computerized transcription errors may still be present.

## 8387-06-23 DEATH — deceased
